# Patient Record
Sex: Female | Born: 1995 | Race: White | Hispanic: No | Marital: Married | Smoking: Current some day smoker
Health system: Southern US, Community
[De-identification: ages and names within clinical notes are randomized; demographics above are authoritative.]

## PROBLEM LIST (undated history)

## (undated) DIAGNOSIS — F419 Anxiety disorder, unspecified: Secondary | ICD-10-CM

## (undated) DIAGNOSIS — F329 Major depressive disorder, single episode, unspecified: Secondary | ICD-10-CM

## (undated) DIAGNOSIS — F32A Depression, unspecified: Secondary | ICD-10-CM

## (undated) DIAGNOSIS — L509 Urticaria, unspecified: Secondary | ICD-10-CM

## (undated) HISTORY — DX: Anxiety disorder, unspecified: F41.9

## (undated) HISTORY — DX: Major depressive disorder, single episode, unspecified: F32.9

## (undated) HISTORY — PX: NO PAST SURGERIES: SHX2092

## (undated) HISTORY — DX: Urticaria, unspecified: L50.9

## (undated) HISTORY — DX: Depression, unspecified: F32.A

---
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
## 2013-01-20 ENCOUNTER — Institutional Professional Consult (permissible substitution): Payer: Medicaid Other | Admitting: Pediatrics

## 2013-12-07 ENCOUNTER — Emergency Department (HOSPITAL_COMMUNITY)
Admission: EM | Admit: 2013-12-07 | Discharge: 2013-12-07 | Disposition: A | Discharge status: Home / Self Care | Payer: Medicaid Other | Attending: Emergency Medicine | Admitting: Emergency Medicine

## 2013-12-07 ENCOUNTER — Encounter (HOSPITAL_COMMUNITY): Payer: Self-pay | Admitting: Emergency Medicine

## 2013-12-07 DIAGNOSIS — L293 Anogenital pruritus, unspecified: Secondary | ICD-10-CM | POA: Insufficient documentation

## 2013-12-07 DIAGNOSIS — Z3202 Encounter for pregnancy test, result negative: Secondary | ICD-10-CM | POA: Insufficient documentation

## 2013-12-07 DIAGNOSIS — R300 Dysuria: Secondary | ICD-10-CM | POA: Insufficient documentation

## 2013-12-07 DIAGNOSIS — N898 Other specified noninflammatory disorders of vagina: Secondary | ICD-10-CM | POA: Insufficient documentation

## 2013-12-07 DIAGNOSIS — F17200 Nicotine dependence, unspecified, uncomplicated: Secondary | ICD-10-CM | POA: Insufficient documentation

## 2013-12-07 LAB — URINE MICROSCOPIC-ADD ON

## 2013-12-07 LAB — WET PREP, GENITAL
Clue Cells Wet Prep HPF POC: NONE SEEN
Trich, Wet Prep: NONE SEEN
Yeast Wet Prep HPF POC: NONE SEEN

## 2013-12-07 LAB — URINALYSIS, ROUTINE W REFLEX MICROSCOPIC
Bilirubin Urine: NEGATIVE
Glucose, UA: NEGATIVE mg/dL
Ketones, ur: NEGATIVE mg/dL
NITRITE: NEGATIVE
PROTEIN: NEGATIVE mg/dL
SPECIFIC GRAVITY, URINE: 1.015 (ref 1.005–1.030)
UROBILINOGEN UA: 0.2 mg/dL (ref 0.0–1.0)
pH: 8 (ref 5.0–8.0)

## 2013-12-07 LAB — POC URINE PREG, ED: PREG TEST UR: NEGATIVE

## 2013-12-07 MED ORDER — PHENAZOPYRIDINE HCL 100 MG PO TABS: 100.0000 mg | ORAL_TABLET | Freq: Three times a day (TID) | ORAL | Status: DC | PRN

## 2013-12-07 MED ORDER — AZITHROMYCIN 250 MG PO TABS
1000.0000 mg | ORAL_TABLET | Freq: Once | ORAL | Status: AC
  Administered 2013-12-07: 1000 mg via ORAL
  Filled 2013-12-07: qty 4

## 2013-12-07 MED ORDER — LIDOCAINE HCL (PF) 1 % IJ SOLN
INTRAMUSCULAR | Status: AC
  Administered 2013-12-07: 21:00:00
  Filled 2013-12-07: qty 5

## 2013-12-07 MED ORDER — PHENAZOPYRIDINE HCL 100 MG PO TABS
200.0000 mg | ORAL_TABLET | Freq: Once | ORAL | Status: AC
  Administered 2013-12-07: 200 mg via ORAL
  Filled 2013-12-07: qty 2

## 2013-12-07 MED ORDER — CEFTRIAXONE SODIUM 250 MG IJ SOLR
250.0000 mg | Freq: Once | INTRAMUSCULAR | Status: AC
  Administered 2013-12-07: 250 mg via INTRAMUSCULAR
  Filled 2013-12-07: qty 250

## 2013-12-07 MED ORDER — ONDANSETRON HCL 4 MG PO TABS
4.0000 mg | ORAL_TABLET | Freq: Once | ORAL | Status: AC
  Administered 2013-12-07: 4 mg via ORAL
  Filled 2013-12-07: qty 1

## 2013-12-07 MED ORDER — CEPHALEXIN 500 MG PO CAPS: 500.0000 mg | ORAL_CAPSULE | Freq: Four times a day (QID) | ORAL | Status: DC

## 2013-12-07 NOTE — Discharge Instructions (Signed)
Please increase fluids. No sexual activity for the next 7 days. Someone from the FLOW MANAGERS office will call with your test results. You have a urinary tract infection. Please increase fluids. Please use pyridium and keflex with food. Please see your [place] access MD for recheck of your urine in 7 to 10 days. Dysuria Dysuria is the medical term for pain with urination. There are many causes for dysuria, but urinary tract infection is the most common. If a urinalysis was performed it can show that there is a urinary tract infection. A urine culture confirms that you or your child is sick. You will need to follow up with a healthcare provider because:  If a urine culture was done you will need to know the culture results and treatment recommendations.  If the urine culture was positive, you or your child will need to be put on antibiotics or know if the antibiotics prescribed are the right antibiotics for your urinary tract infection.  If the urine culture is negative (no urinary tract infection), then other causes may need to be explored or antibiotics need to be stopped. Today laboratory work may have been done and there does not seem to be an infection. If cultures were done they will take at least 24 to 48 hours to be completed. Today x-rays may have been taken and they read as normal. No cause can be found for the problems. The x-rays may be re-read by a radiologist and you will be contacted if additional findings are made. You or your child may have been put on medications to help with this problem until you can see your primary caregiver. If the problems get better, see your primary caregiver if the problems return. If you were given antibiotics (medications which kill germs), take all of the mediations as directed for the full course of treatment.  If laboratory work was done, you need to find the results. Leave a telephone number where you can be reached. If this is not possible, make sure you find  out how you are to get test results. HOME CARE INSTRUCTIONS   Drink lots of fluids. For adults, drink eight, 8 ounce glasses of clear juice or water a day. For children, replace fluids as suggested by your caregiver.  Empty the bladder often. Avoid holding urine for long periods of time.  After a bowel movement, women should cleanse front to back, using each tissue only once.  Empty your bladder before and after sexual intercourse.  Take all the medicine given to you until it is gone. You may feel better in a few days, but TAKE ALL MEDICINE.  Avoid caffeine, tea, alcohol and carbonated beverages, because they tend to irritate the bladder.  In men, alcohol may irritate the prostate.  Only take over-the-counter or prescription medicines for pain, discomfort, or fever as directed by your caregiver.  If your caregiver has given you a follow-up appointment, it is very important to keep that appointment. Not keeping the appointment could result in a chronic or permanent injury, pain, and disability. If there is any problem keeping the appointment, you must call back to this facility for assistance. SEEK IMMEDIATE MEDICAL CARE IF:   Back pain develops.  A fever develops.  There is nausea (feeling sick to your stomach) or vomiting (throwing up).  Problems are no better with medications or are getting worse. MAKE SURE YOU:   Understand these instructions.  Will watch your condition.  Will get help right away if you  are not doing well or get worse. Document Released: 12/30/2003 Document Revised: 06/25/2011 Document Reviewed: 11/06/2007 [facility] Patient Information 2015 [place], [place]. This information is not intended to replace advice given to you by your health care provider. Make sure you discuss any questions you have with your health care provider.

## 2013-12-07 NOTE — ED Provider Notes (Signed)
CSN: [id number]     Arrival date & time 12/07/13  1802 History  This chart was scribed for Ivery Quale, PA, working with Vanetta Mulders, MD found by Elon Spanner, ED Scribe. This patient was seen in room APFT20/APFT20 and the patient's care was started at 7:22 PM.    Chief Complaint  Patient presents with  . Vaginal Itching   Patient is a 18 y.o. female presenting with vaginal itching. The history is provided by the patient. No language interpreter was used.  Vaginal Itching  Vaginal Itching    HPI Comments: Amy Whitaker is a 18 y.o. female who presents to the Emergency Department complaining of vaginal itching, vaginal irritation and discomfort with urination onset 3 days ago with progressive worsening.  Patient states she has undergone natural child birth approximately 2 years ago.  Patient denies any previous operation involving vagina.  She denies temperature elevation, chills, injury/trauma, unusual vaginal bleeding.     History reviewed. No pertinent past medical history. History reviewed. No pertinent past surgical history. History reviewed. No pertinent family history. History  Substance Use Topics  . Smoking status: Light Tobacco Smoker  . Smokeless tobacco: Not on file  . Alcohol Use: No   OB History   Grav Para Term Preterm Abortions TAB SAB Ect Mult Living                 Review of Systems  Genitourinary: Positive for dysuria, vaginal discharge and vaginal pain.  All other systems reviewed and are negative.     Allergies  Review of patient's allergies indicates no known allergies.  Home Medications   Prior to Admission medications   Medication Sig Start Date End Date Taking? Authorizing Provider  Benzocaine-Resorcinol (VAGICAINE) 20-3 % CREA Place 1 application vaginally once as needed.   Yes Historical Provider, MD   BP 107/88  Pulse 87  Temp(Src) 99.2 F (37.3 C) (Oral)  Resp 24  Ht  (1.499 m)  Wt 91 lb 9.6 oz (41.549 kg)  BMI 18.49  kg/m2  SpO2 100%  LMP 11/16/2013 Physical Exam  Nursing note and vitals reviewed. Constitutional: She is oriented to person, place, and time. She appears well-developed and well-nourished. No distress.  HENT:  Head: Normocephalic and atraumatic.  Eyes: Conjunctivae and EOM are normal.  Neck: Neck supple. No tracheal deviation present.  Cardiovascular: Normal rate.   Pulmonary/Chest: Effort normal. No respiratory distress.  Abdominal: Soft. Bowel sounds are normal. She exhibits no distension and no mass. There is no tenderness. There is no rebound and no guarding.  Genitourinary:  Chaperone present during examination.  No abnormality of the external vaginal area or rash.  No foreign body in the vaginal vault.  There is a brown to green discharge in the vaginal vault.  The cervix is friable.  Mild to moderate discomfort in the adnexa area but no actual pain.  No mass in the adnexa area.    Musculoskeletal: Normal range of motion.  Neurological: She is alert and oriented to person, place, and time.  Skin: Skin is warm and dry.  Psychiatric: She has a normal mood and affect. Her behavior is normal.    ED Course  Procedures (including critical care time)  DIAGNOSTIC STUDIES: Oxygen Saturation is 100% on RA, normal by my interpretation.    COORDINATION OF CARE:  7:35 PM Discussed treatment plan with patient at bedside.  Patient acknowledges and agrees with plan.    Labs Review Labs Reviewed  GC/CHLAMYDIA PROBE AMP  WET PREP, GENITAL    Imaging Review No results found.   EKG Interpretation None      MDM Urine pregnancy is negative. Wet prep reveals moderate white blood cells otherwise negative. Urinalysis shows a yellow hazy specimen with a large amount of hemoglobin, and trace leukocyte esterase. The microscopic reveals 11-20 red cells, 0-2 white cells. Many epithelial cells are noted. The urine was sent to the lab for culture. The patient was treated in the emergency  department with Rocephin and Zithromax. Patient was given instructions that someone from the flow managers office will be calling her concerning the results of her test. She is encouraged to refrain from sexual activity until these tests have come back.    Final diagnoses:  None    **I have reviewed nursing notes, vital signs, and all appropriate lab and imaging results for this patient.  **I personally performed the services described in this documentation, which was scribed in my presence. The recorded information has been reviewed and is accurate.Kathie Dike, PA-C 12/08/13 1640

## 2013-12-07 NOTE — ED Notes (Signed)
Pt with vaginal itching, burning and redness; discharge from urethra

## 2013-12-09 LAB — URINE CULTURE
Colony Count: NO GROWTH
Culture: NO GROWTH

## 2013-12-09 LAB — GC/CHLAMYDIA PROBE AMP
CT Probe RNA: NEGATIVE
GC PROBE AMP APTIMA: NEGATIVE

## 2013-12-11 ENCOUNTER — Emergency Department (HOSPITAL_COMMUNITY)
Admission: EM | Admit: 2013-12-11 | Discharge: 2013-12-12 | Disposition: A | Discharge status: Home / Self Care | Payer: Medicaid Other | Attending: Emergency Medicine | Admitting: Emergency Medicine

## 2013-12-11 ENCOUNTER — Encounter (HOSPITAL_COMMUNITY): Payer: Self-pay | Admitting: Emergency Medicine

## 2013-12-11 DIAGNOSIS — A6009 Herpesviral infection of other urogenital tract: Secondary | ICD-10-CM

## 2013-12-11 DIAGNOSIS — F17200 Nicotine dependence, unspecified, uncomplicated: Secondary | ICD-10-CM | POA: Insufficient documentation

## 2013-12-11 DIAGNOSIS — A6000 Herpesviral infection of urogenital system, unspecified: Secondary | ICD-10-CM | POA: Insufficient documentation

## 2013-12-11 DIAGNOSIS — N898 Other specified noninflammatory disorders of vagina: Secondary | ICD-10-CM | POA: Insufficient documentation

## 2013-12-11 DIAGNOSIS — Z792 Long term (current) use of antibiotics: Secondary | ICD-10-CM | POA: Insufficient documentation

## 2013-12-11 NOTE — ED Notes (Signed)
Pt c/o vaginal irritation, redness, swelling , and brown discharge. Pt seen for the same Monday, but states it has gotten worse.

## 2013-12-12 LAB — URINE MICROSCOPIC-ADD ON

## 2013-12-12 LAB — WET PREP, GENITAL
Clue Cells Wet Prep HPF POC: NONE SEEN
TRICH WET PREP: NONE SEEN
Yeast Wet Prep HPF POC: NONE SEEN

## 2013-12-12 LAB — URINALYSIS, ROUTINE W REFLEX MICROSCOPIC
Bilirubin Urine: NEGATIVE
Glucose, UA: NEGATIVE mg/dL
Ketones, ur: NEGATIVE mg/dL
NITRITE: NEGATIVE
PH: 6 (ref 5.0–8.0)
Protein, ur: NEGATIVE mg/dL
SPECIFIC GRAVITY, URINE: 1.025 (ref 1.005–1.030)
UROBILINOGEN UA: 0.2 mg/dL (ref 0.0–1.0)

## 2013-12-12 MED ORDER — OXYCODONE-ACETAMINOPHEN 5-325 MG PO TABS: 1.0000 | ORAL_TABLET | ORAL | Status: DC | PRN

## 2013-12-12 MED ORDER — VALACYCLOVIR HCL 1 G PO TABS
1000.0000 mg | ORAL_TABLET | Freq: Two times a day (BID) | ORAL | Status: DC
Start: 2013-12-12 — End: 2015-07-22

## 2013-12-12 NOTE — ED Provider Notes (Signed)
CSN: [id number]     Arrival date & time 12/11/13  2335 History   First MD Initiated Contact with Patient 12/11/13 2359    This chart was scribed for Dione Booze, MD by Marica Otter, ED Scribe. This patient was seen in room [facility]10/[facility]10 and the patient's care was started at 12:05 AM.  Chief Complaint  Patient presents with  . Vaginal Discharge   The history is provided by the patient. No language interpreter was used.   PCP: Josue Hector, MD HPI Comments: Amy Whitaker is a 18 y.o. female, with a history of light tobacco use, who presents to the Emergency Department complaining of worsening brown vaginal discharge with associated dysuria and vaginal: pain, irritation, redness and swelling onset approximately 8 days ago. Pt rates her pain a 6 out of 10. Pt was seen for the same at the ED on 12/07/13 and was prescribed antibiotics for her Sx and she reports she has been compliant with the meds. Pt notes that while the dysuria has improved the remainder of her Sx have worsened since her last ED visit.   Pt reports she is not sexually active.   History reviewed. No pertinent past medical history. History reviewed. No pertinent past surgical history. History reviewed. No pertinent family history. History  Substance Use Topics  . Smoking status: Light Tobacco Smoker  . Smokeless tobacco: Not on file  . Alcohol Use: No   OB History   Grav Para Term Preterm Abortions TAB SAB Ect Mult Living                 Review of Systems  Constitutional: Negative for fever and chills.  Genitourinary: Positive for vaginal discharge.       Vaginal itching, redness and swelling   All other systems reviewed and are negative.  Allergies  Review of patient's allergies indicates no known allergies.  Home Medications   Prior to Admission medications   Medication Sig Start Date End Date Taking? Authorizing Provider  Benzocaine-Resorcinol (VAGICAINE) 20-3 % CREA Place 1 application vaginally once  as needed (pt states she did receive this prescription.).     Historical Provider, MD  cephALEXin (KEFLEX) 500 MG capsule Take 1 capsule (500 mg total) by mouth 4 (four) times daily. 12/07/13   Kathie Dike, PA-C  phenazopyridine (PYRIDIUM) 100 MG tablet Take 100 mg by mouth 3 (three) times daily as needed for pain (did not get it filled.). 12/07/13   Kathie Dike, PA-C   Triage Vitals: BP 107/72  Pulse 84  Temp(Src) 97.7 F (36.5 C) (Oral)  Resp 18  SpO2 100%  LMP 11/16/2013 Physical Exam  Nursing note and vitals reviewed. Constitutional: She is oriented to person, place, and time. She appears well-developed and well-nourished. No distress.  HENT:  Head: Normocephalic and atraumatic.  Eyes: Conjunctivae and EOM are normal. Pupils are equal, round, and reactive to light.  Neck: Normal range of motion. Neck supple. No JVD present. No tracheal deviation present.  Cardiovascular: Normal rate, regular rhythm and normal heart sounds.   No murmur heard. Pulmonary/Chest: Effort normal and breath sounds normal. She has no wheezes. She has no rales.  Abdominal: Soft. Bowel sounds are normal. She exhibits no distension and no mass. There is no tenderness.  Genitourinary: Vagina normal and uterus normal. No vaginal discharge found.  Erythema and tenderness of the labia minora with suggestion of early vesicle formation.  Musculoskeletal: Normal range of motion. She exhibits no edema.  Lymphadenopathy:  She has no cervical adenopathy.  Neurological: She is alert and oriented to person, place, and time. She has normal reflexes. No cranial nerve deficit. Coordination normal.  Skin: Skin is warm and dry. No rash noted.  Psychiatric: She has a normal mood and affect. Her behavior is normal. Thought content normal.    ED Course  Procedures (including critical care time) DIAGNOSTIC STUDIES: Oxygen Saturation is 100% on RA, nl by my interpretation.    COORDINATION OF CARE: 12:09 AM-Discussed  treatment plan which includes labs and pelvic exam with pt at bedside and pt agreed to plan.   Labs Review Results for orders placed during the hospital encounter of 12/11/13  WET PREP, GENITAL      Result Value Ref Range   Yeast Wet Prep HPF POC NONE SEEN  NONE SEEN   Trich, Wet Prep NONE SEEN  NONE SEEN   Clue Cells Wet Prep HPF POC NONE SEEN  NONE SEEN   WBC, Wet Prep HPF POC FEW (*) NONE SEEN  URINALYSIS, ROUTINE W REFLEX MICROSCOPIC      Result Value Ref Range   Color, Urine YELLOW  YELLOW   APPearance HAZY (*) CLEAR   Specific Gravity, Urine 1.025  1.005 - 1.030   pH 6.0  5.0 - 8.0   Glucose, UA NEGATIVE  NEGATIVE mg/dL   Hgb urine dipstick MODERATE (*) NEGATIVE   Bilirubin Urine NEGATIVE  NEGATIVE   Ketones, ur NEGATIVE  NEGATIVE mg/dL   Protein, ur NEGATIVE  NEGATIVE mg/dL   Urobilinogen, UA 0.2  0.0 - 1.0 mg/dL   Nitrite NEGATIVE  NEGATIVE   Leukocytes, UA TRACE (*) NEGATIVE  URINE MICROSCOPIC-ADD ON      Result Value Ref Range   Squamous Epithelial / LPF FEW (*) RARE   WBC, UA 3-6  <3 WBC/hpf   RBC / HPF 11-20  <3 RBC/hpf   MDM   Final diagnoses:  Herpes genitalis in women    Painful genital rash which seems most consistent with genital herpes. She is given a prescription for valacyclovir and is discharged. She is also given a prescription for oxycodone and acetaminophen for pain. All the records are reviewed and she was seen here 4 days ago at which time GC and Chlamydia cultures were negative. There is no indication for repeating cultures.  I personally performed the services described in this documentation, which was scribed in my presence. The recorded information has been reviewed and is accurate.     Dione Booze, MD 12/12/13 [phone]

## 2013-12-12 NOTE — Discharge Instructions (Signed)
Genital Herpes Genital herpes is a sexually transmitted disease. This means that it is a disease passed by having sex with an infected person. There is no cure for genital herpes. The time between attacks can be months to years. The virus may live in a person but produce no problems (symptoms). This infection can be passed to a baby as it travels down the birth canal (vagina). In a newborn, this can cause central nervous system damage, eye damage, or even death. The virus that causes genital herpes is usually HSV-2 virus. The virus that causes oral herpes is usually HSV-1. The diagnosis (learning what is wrong) is made through culture results. SYMPTOMS  Usually symptoms of pain and itching begin a few days to a week after contact. It first appears as small blisters that progress to small painful ulcers which then scab over and heal after several days. It affects the outer genitalia, birth canal, cervix, penis, anal area, buttocks, and thighs. HOME CARE INSTRUCTIONS   Keep ulcerated areas dry and clean.  Take medications as directed. Antiviral medications can speed up healing. They will not prevent recurrences or cure this infection. These medications can also be taken for suppression if there are frequent recurrences.  While the infection is active, it is contagious. Avoid all sexual contact during active infections.  Condoms may help prevent spread of the herpes virus.  Practice safe sex.  Wash your hands thoroughly after touching the genital area.  Avoid touching your eyes after touching your genital area.  Inform your caregiver if you have had genital herpes and become pregnant. It is your responsibility to insure a safe outcome for your baby in this pregnancy.  Only take over-the-counter or prescription medicines for pain, discomfort, or fever as directed by your caregiver. SEEK MEDICAL CARE IF:   You have a recurrence of this infection.  You do not respond to medications and are not  improving.  You have new sources of pain or discharge which have changed from the original infection.  You have an oral temperature above 102 F (38.9 C).  You develop abdominal pain.  You develop eye pain or signs of eye infection. Document Released: 03/30/2000 Document Revised: 06/25/2011 Document Reviewed: 04/20/2009 [facility] Patient Information 2015 [place], [place]. This information is not intended to replace advice given to you by your health care provider. Make sure you discuss any questions you have with your health care provider.  Valacyclovir caplets What is this medicine? VALACYCLOVIR (val ay SYE kloe veer) is an antiviral medicine. It is used to treat or prevent infections caused by certain kinds of viruses. Examples of these infections include herpes and shingles. This medicine will not cure herpes. This medicine may be used for other purposes; ask your health care provider or pharmacist if you have questions. COMMON BRAND NAME(S): Valtrex What should I tell my health care provider before I take this medicine? They need to know if you have any of these conditions: -acquired immunodeficiency syndrome (AIDS) -any other condition that may weaken the immune system -bone marrow or kidney transplant -kidney disease -an unusual or allergic reaction to valacyclovir, acyclovir, ganciclovir, valganciclovir, other medicines, foods, dyes, or preservatives -pregnant or trying to get pregnant -breast-feeding How should I use this medicine? Take this medicine by mouth with a glass of water. Follow the directions on the prescription label. You can take this medicine with or without food. Take your doses at regular intervals. Do not take your medicine more often than directed. Finish the full  course prescribed by your doctor or health care professional even if you think your condition is better. Do not stop taking except on the advice of your doctor or health care professional. Talk to your  pediatrician regarding the use of this medicine in children. While this drug may be prescribed for children as young as 2 years for selected conditions, precautions do apply. Overdosage: If you think you have taken too much of this medicine contact a poison control center or emergency room at once. NOTE: This medicine is only for you. Do not share this medicine with others. What if I miss a dose? If you miss a dose, take it as soon as you can. If it is almost time for your next dose, take only that dose. Do not take double or extra doses. What may interact with this medicine? -cimetidine -probenecid This list may not describe all possible interactions. Give your health care provider a list of all the medicines, herbs, non-prescription drugs, or dietary supplements you use. Also tell them if you smoke, drink alcohol, or use illegal drugs. Some items may interact with your medicine. What should I watch for while using this medicine? Tell your doctor or health care professional if your symptoms do not start to get better after 1 week. This medicine works best when taken early in the course of an infection, within the first 72 hours. Begin treatment as soon as possible after the first signs of infection like tingling, itching, or pain in the affected area. It is possible that genital herpes may still be spread even when you are not having symptoms. Always use safer sex practices like condoms made of latex or polyurethane whenever you have sexual contact. You should stay well hydrated while taking this medicine. Drink plenty of fluids. What side effects may I notice from receiving this medicine? Side effects that you should report to your doctor or health care professional as soon as possible: -allergic reactions like skin rash, itching or hives, swelling of the face, lips, or tongue -aggressive behavior -confusion -hallucinations -problems with balance, talking, walking -stomach  pain -tremor -trouble passing urine or change in the amount of urine Side effects that usually do not require medical attention (report to your doctor or health care professional if they continue or are bothersome): -dizziness -headache -nausea, vomiting This list may not describe all possible side effects. Call your doctor for medical advice about side effects. You may report side effects to FDA at 1-800-FDA-1088. Where should I keep my medicine? Keep out of the reach of children. Store at room temperature between 15 and 25 degrees C (59 and 77 degrees F). Keep container tightly closed. Throw away any unused medicine after the expiration date. NOTE: This sheet is a summary. It may not cover all possible information. If you have questions about this medicine, talk to your doctor, pharmacist, or health care provider.  2015, Elsevier/Gold Standard. (2012-03-18 16:34:05)  Acetaminophen; Oxycodone tablets What is this medicine? ACETAMINOPHEN; OXYCODONE (a set a MEE noe fen; ox i KOE done) is a pain reliever. It is used to treat mild to moderate pain. This medicine may be used for other purposes; ask your health care provider or pharmacist if you have questions. COMMON BRAND NAME(S): Endocet, Magnacet, Narvox, Percocet, Perloxx, Primalev, Primlev, Roxicet, Xolox What should I tell my health care provider before I take this medicine? They need to know if you have any of these conditions: -brain tumor -Crohn's disease, inflammatory bowel disease, or ulcerative colitis -drug  abuse or addiction -head injury -heart or circulation problems -if you often drink alcohol -kidney disease or problems going to the bathroom -liver disease -lung disease, asthma, or breathing problems -an unusual or allergic reaction to acetaminophen, oxycodone, other opioid analgesics, other medicines, foods, dyes, or preservatives -pregnant or trying to get pregnant -breast-feeding How should I use this  medicine? Take this medicine by mouth with a full glass of water. Follow the directions on the prescription label. Take your medicine at regular intervals. Do not take your medicine more often than directed. Talk to your pediatrician regarding the use of this medicine in children. Special care may be needed. Patients over 87 years old may have a stronger reaction and need a smaller dose. Overdosage: If you think you have taken too much of this medicine contact a poison control center or emergency room at once. NOTE: This medicine is only for you. Do not share this medicine with others. What if I miss a dose? If you miss a dose, take it as soon as you can. If it is almost time for your next dose, take only that dose. Do not take double or extra doses. What may interact with this medicine? -alcohol -antihistamines -barbiturates like amobarbital, butalbital, butabarbital, methohexital, pentobarbital, phenobarbital, thiopental, and secobarbital -benztropine -drugs for bladder problems like solifenacin, trospium, oxybutynin, tolterodine, hyoscyamine, and methscopolamine -drugs for breathing problems like ipratropium and tiotropium -drugs for certain stomach or intestine problems like propantheline, homatropine methylbromide, glycopyrrolate, atropine, belladonna, and dicyclomine -general anesthetics like etomidate, ketamine, nitrous oxide, propofol, desflurane, enflurane, halothane, isoflurane, and sevoflurane -medicines for depression, anxiety, or psychotic disturbances -medicines for sleep -muscle relaxants -naltrexone -narcotic medicines (opiates) for pain -phenothiazines like perphenazine, thioridazine, chlorpromazine, mesoridazine, fluphenazine, prochlorperazine, promazine, and trifluoperazine -scopolamine -tramadol -trihexyphenidyl This list may not describe all possible interactions. Give your health care provider a list of all the medicines, herbs, non-prescription drugs, or dietary  supplements you use. Also tell them if you smoke, drink alcohol, or use illegal drugs. Some items may interact with your medicine. What should I watch for while using this medicine? Tell your doctor or health care professional if your pain does not go away, if it gets worse, or if you have new or a different type of pain. You may develop tolerance to the medicine. Tolerance means that you will need a higher dose of the medication for pain relief. Tolerance is normal and is expected if you take this medicine for a long time. Do not suddenly stop taking your medicine because you may develop a severe reaction. Your body becomes used to the medicine. This does NOT mean you are addicted. Addiction is a behavior related to getting and using a drug for a non-medical reason. If you have pain, you have a medical reason to take pain medicine. Your doctor will tell you how much medicine to take. If your doctor wants you to stop the medicine, the dose will be slowly lowered over time to avoid any side effects. You may get drowsy or dizzy. Do not drive, use machinery, or do anything that needs mental alertness until you know how this medicine affects you. Do not stand or sit up quickly, especially if you are an older patient. This reduces the risk of dizzy or fainting spells. Alcohol may interfere with the effect of this medicine. Avoid alcoholic drinks. There are different types of narcotic medicines (opiates) for pain. If you take more than one type at the same time, you may have more side effects.  Give your health care provider a list of all medicines you use. Your doctor will tell you how much medicine to take. Do not take more medicine than directed. Call emergency for help if you have problems breathing. The medicine will cause constipation. Try to have a bowel movement at least every 2 to 3 days. If you do not have a bowel movement for 3 days, call your doctor or health care professional. Do not take Tylenol  (acetaminophen) or medicines that have acetaminophen with this medicine. Too much acetaminophen can be very dangerous. Many nonprescription medicines contain acetaminophen. Always read the labels carefully to avoid taking more acetaminophen. What side effects may I notice from receiving this medicine? Side effects that you should report to your doctor or health care professional as soon as possible: -allergic reactions like skin rash, itching or hives, swelling of the face, lips, or tongue -breathing difficulties, wheezing -confusion -light headedness or fainting spells -severe stomach pain -unusually weak or tired -yellowing of the skin or the whites of the eyes Side effects that usually do not require medical attention (report to your doctor or health care professional if they continue or are bothersome): -dizziness -drowsiness -nausea -vomiting This list may not describe all possible side effects. Call your doctor for medical advice about side effects. You may report side effects to FDA at 1-800-FDA-1088. Where should I keep my medicine? Keep out of the reach of children. This medicine can be abused. Keep your medicine in a safe place to protect it from theft. Do not share this medicine with anyone. Selling or giving away this medicine is dangerous and against the law. Store at room temperature between 20 and 25 degrees C (68 and 77 degrees F). Keep container tightly closed. Protect from light. This medicine may cause accidental overdose and death if it is taken by other adults, children, or pets. Flush any unused medicine down the toilet to reduce the chance of harm. Do not use the medicine after the expiration date. NOTE: This sheet is a summary. It may not cover all possible information. If you have questions about this medicine, talk to your doctor, pharmacist, or health care provider.  2015, Elsevier/Gold Standard. (2012-11-24 13:17:35)

## 2013-12-16 MED FILL — Hydrocodone-Acetaminophen Tab 5-325 MG: ORAL | Qty: 6 | Status: AC

## 2013-12-16 NOTE — ED Provider Notes (Signed)
Medical screening examination/treatment/procedure(s) were performed by non-physician practitioner and as supervising physician I was immediately available for consultation/collaboration.   EKG Interpretation None        Vanetta Mulders, MD 12/16/13 [phone]

## 2015-07-22 ENCOUNTER — Emergency Department (HOSPITAL_COMMUNITY)
Admission: EM | Admit: 2015-07-22 | Discharge: 2015-07-22 | Disposition: A | Discharge status: Home / Self Care | Payer: Medicaid Other | Attending: Emergency Medicine | Admitting: Emergency Medicine

## 2015-07-22 ENCOUNTER — Encounter (HOSPITAL_COMMUNITY): Payer: Self-pay | Admitting: *Deleted

## 2015-07-22 DIAGNOSIS — Y9289 Other specified places as the place of occurrence of the external cause: Secondary | ICD-10-CM | POA: Diagnosis not present

## 2015-07-22 DIAGNOSIS — T2200XA Burn of unspecified degree of shoulder and upper limb, except wrist and hand, unspecified site, initial encounter: Secondary | ICD-10-CM | POA: Diagnosis present

## 2015-07-22 DIAGNOSIS — T2210XA Burn of first degree of shoulder and upper limb, except wrist and hand, unspecified site, initial encounter: Secondary | ICD-10-CM | POA: Diagnosis not present

## 2015-07-22 DIAGNOSIS — Y939 Activity, unspecified: Secondary | ICD-10-CM | POA: Insufficient documentation

## 2015-07-22 DIAGNOSIS — X18XXXA Contact with other hot metals, initial encounter: Secondary | ICD-10-CM | POA: Diagnosis not present

## 2015-07-22 DIAGNOSIS — Y999 Unspecified external cause status: Secondary | ICD-10-CM | POA: Insufficient documentation

## 2015-07-22 DIAGNOSIS — Z23 Encounter for immunization: Secondary | ICD-10-CM | POA: Insufficient documentation

## 2015-07-22 DIAGNOSIS — Z87891 Personal history of nicotine dependence: Secondary | ICD-10-CM | POA: Insufficient documentation

## 2015-07-22 MED ORDER — IBUPROFEN 400 MG PO TABS
600.0000 mg | ORAL_TABLET | Freq: Once | ORAL | Status: AC
  Administered 2015-07-22: 600 mg via ORAL
  Filled 2015-07-22: qty 2

## 2015-07-22 MED ORDER — TETANUS-DIPHTH-ACELL PERTUSSIS 5-2.5-18.5 LF-MCG/0.5 IM SUSP
0.5000 mL | Freq: Once | INTRAMUSCULAR | Status: AC
  Administered 2015-07-22: 0.5 mL via INTRAMUSCULAR
  Filled 2015-07-22: qty 0.5
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
## 2015-07-22 NOTE — ED Provider Notes (Signed)
CSN: [id number]     Arrival date & time 07/22/15  2147 History   First MD Initiated Contact with Patient 07/22/15 2202     Chief Complaint  Patient presents with  . Burn     (Consider location/radiation/quality/duration/timing/severity/associated sxs/prior Treatment) Patient is a 20 y.o. female presenting with burn. The history is provided by the patient.  Burn Burn location:  Shoulder/arm Shoulder/arm burn location:  R arm Burn quality:  Red Time since incident:  4 hours Progression:  Unchanged Mechanism of burn:  Hot surface Incident location:  Work Relieved by:  Running affected area under water and NSAIDs Worsened by:  Movement   History reviewed. No pertinent past medical history. History reviewed. No pertinent past surgical history. No family history on file. Social History  Substance Use Topics  . Smoking status: Former Games developermoker  . Smokeless tobacco: None  . Alcohol Use: Yes     Comment: occasional   OB History    No data available     Review of Systems    Allergies  Review of patient's allergies indicates no known allergies.  Home Medications   Prior to Admission medications   Medication Sig Start Date End Date Taking? Authorizing Provider  Benzocaine-Resorcinol (VAGICAINE) 20-3 % CREA Place 1 application vaginally once as needed (pt states she did receive this prescription.).     Historical Provider, MD  cephALEXin (KEFLEX) 500 MG capsule Take 1 capsule (500 mg total) by mouth 4 (four) times daily. 12/07/13   Ivery QualeHobson Boniface Goffe, PA-C  oxyCODONE-acetaminophen (PERCOCET/ROXICET) 5-325 MG per tablet Take 1 tablet by mouth every 4 (four) hours as needed. 12/12/13   Dione Boozeavid Glick, MD  oxyCODONE-acetaminophen (PERCOCET/ROXICET) 5-325 MG per tablet Take 1 tablet by mouth every 4 (four) hours as needed. 12/12/13   Dione Boozeavid Glick, MD  phenazopyridine (PYRIDIUM) 100 MG tablet Take 100 mg by mouth 3 (three) times daily as needed for pain (did not get it filled.). 12/07/13   Ivery QualeHobson  Shundra Wirsing, PA-C  valACYclovir (VALTREX) 1000 MG tablet Take 1 tablet (1,000 mg total) by mouth 2 (two) times daily. 12/12/13   Dione Boozeavid Glick, MD   BP 102/69 mmHg  Pulse 82  Temp(Src) 98.7 F (37.1 C) (Oral)  Resp 16  Ht 5\' 1"  (1.549 m)  Wt 47.628 kg  BMI 19.85 kg/m2  SpO2 100%  LMP 06/29/2015 Physical Exam  Constitutional: She is oriented to person, place, and time. She appears well-developed and well-nourished.  Non-toxic appearance.  HENT:  Head: Normocephalic.  Right Ear: Tympanic membrane and external ear normal.  Left Ear: Tympanic membrane and external ear normal.  Eyes: EOM and lids are normal. Pupils are equal, round, and reactive to light.  Neck: Normal range of motion. Neck supple. Carotid bruit is not present.  Cardiovascular: Normal rate, regular rhythm, normal heart sounds, intact distal pulses and normal pulses.   Pulmonary/Chest: Breath sounds normal. No respiratory distress.  Abdominal: Soft. Bowel sounds are normal. There is no tenderness. There is no guarding.  Musculoskeletal: Normal range of motion.       Right forearm: She exhibits tenderness.       Arms: There is a 3 cm area of first-degree burn just below the elbow at the right forearm. There is no blister,. No red streaks appreciated. There is full range of motion of the right upper extremity.  Lymphadenopathy:       Head (right side): No submandibular adenopathy present.       Head (left side): No submandibular adenopathy present.  She has no cervical adenopathy.  Neurological: She is alert and oriented to person, place, and time. She has normal strength. No cranial nerve deficit or sensory deficit.  Skin: Skin is warm and dry.  Psychiatric: She has a normal mood and affect. Her speech is normal.  Nursing note and vitals reviewed.   ED Course  Procedures (including critical care time) Labs Review Labs Reviewed - No data to display  Imaging Review No results found. I have personally reviewed and  evaluated these images and lab results as part of my medical decision-making.   EKG Interpretation None      MDM Patient has a small area of first-degree burn on the right forearm. She sustained it at work earlier this evening while working with a hot piece of metal. The patient's tetanus status was updated. A dressing was applied to support the burn area and protected. Patient was given an ice pack to use. Patient was also treated with Motrin. The patient is to follow with her primary physician if any changes, problems, or concerns. Patient is in agreement with this discharge plan.    Final diagnoses:  None    *I have reviewed nursing notes, vital signs, and all appropriate lab and imaging results for this patient.[id number], PA-C 07/22/15 2250  Bethann Berkshire, MD 07/22/15 [phone]

## 2015-07-22 NOTE — Discharge Instructions (Signed)
Please cleanse the burn area gently with soap and water. Apply a dressing to protect the area. Use Tylenol or ibuprofen for soreness. See Dr. Lysbeth GalasNyland for follow-up and recheck if not improving. Burn Care Burns hurt your skin. When your skin is hurt, it is easier to get an infection. Follow your doctor's directions to help prevent an infection. HOME CARE  Wash your hands well before you change your bandage.  Change your bandage as often as told by your doctor.  Remove the old bandage. If the bandage sticks, soak it off with cool, clean water.  Gently clean the burn with mild soap and water.  Pat the burn dry with a clean, dry cloth.  Put a thin layer of medicated cream on the burn.  Put a clean bandage on as told by your doctor.  Keep the bandage clean and dry.  Raise (elevate) the burn for the first 24 hours. After that, follow your doctor's directions.  Only take medicine as told by your doctor. GET HELP RIGHT AWAY IF:   You have too much pain.  The skin near the burn is red, tender, puffy (swollen), or has red streaks.  The burn area has yellowish white fluid (pus) or a bad smell coming from it.  You have a fever. MAKE SURE YOU:   Understand these instructions.  Will watch your condition.  Will get help right away if you are not doing well or get worse.   This information is not intended to replace advice given to you by your health care provider. Make sure you discuss any questions you have with your health care provider.   Document Released: 01/10/2008 Document Revised: 06/25/2011 Document Reviewed: 08/23/2010 Elsevier Interactive Patient Education [employer]2016 Elsevier Inc.

## 2015-07-22 NOTE — ED Notes (Signed)
PA in to assess 

## 2015-07-22 NOTE — ED Notes (Signed)
Pt burnt her right arm just below her elbow, at work,  on a hot piece of metal around 6 pm.

## 2015-10-23 ENCOUNTER — Encounter (HOSPITAL_COMMUNITY): Payer: Self-pay | Admitting: Emergency Medicine

## 2015-10-23 ENCOUNTER — Emergency Department (HOSPITAL_COMMUNITY)
Admission: EM | Admit: 2015-10-23 | Discharge: 2015-10-23 | Disposition: A | Discharge status: Home / Self Care | Payer: Medicaid Other | Attending: Emergency Medicine | Admitting: Emergency Medicine

## 2015-10-23 DIAGNOSIS — Z79899 Other long term (current) drug therapy: Secondary | ICD-10-CM | POA: Diagnosis not present

## 2015-10-23 DIAGNOSIS — Z791 Long term (current) use of non-steroidal anti-inflammatories (NSAID): Secondary | ICD-10-CM | POA: Diagnosis not present

## 2015-10-23 DIAGNOSIS — Z87891 Personal history of nicotine dependence: Secondary | ICD-10-CM | POA: Insufficient documentation

## 2015-10-23 DIAGNOSIS — L509 Urticaria, unspecified: Secondary | ICD-10-CM | POA: Diagnosis not present

## 2015-10-23 MED ORDER — PREDNISONE 20 MG PO TABS: ORAL_TABLET | ORAL | Status: DC

## 2015-10-23 MED ORDER — DIPHENHYDRAMINE HCL 25 MG PO CAPS
25.0000 mg | ORAL_CAPSULE | Freq: Once | ORAL | Status: AC
  Administered 2015-10-23: 25 mg via ORAL
  Filled 2015-10-23: qty 1

## 2015-10-23 MED ORDER — PREDNISONE 50 MG PO TABS
60.0000 mg | ORAL_TABLET | Freq: Once | ORAL | Status: AC
  Administered 2015-10-23: 60 mg via ORAL
  Filled 2015-10-23: qty 1

## 2015-10-23 NOTE — ED Notes (Addendum)
Patient c/o hives. Per patient hives from "head to toe" with itching. Denies any difficulty swallowing or breath. Patient reports hives x2 days in which she has been taking benadryl and zyrtec. Patient unsure what reaction is coming from. Patient [facility] 2 days ago and given zyrtec, solumedrol, and benadry. Per patient hives progressively getting worse. Denies any new medications, foods, detergents, or soaps.

## 2015-10-23 NOTE — ED Provider Notes (Addendum)
CSN: [id number]     Arrival date & time 10/23/15  1325 History   First MD Initiated Contact with Patient 10/23/15 1540     Chief Complaint  Patient presents with  . Urticaria     (Consider location/radiation/quality/duration/timing/severity/associated sxs/prior Treatment) HPI  20 year old female presents with diffuse hives. Patient states that this developed 2 days ago. She is a Nature conservation officerstocker at [employer] and she looked down and noticed redness to her left arm. Then pulled up her shirt and had some on her trunk. It has been spreading since. Patient states that she has tried Benadryl, Zyrtec, and was given a Solu-Medrol shot by urgent care yesterday. However has had no relief. No obvious source. Denies any insect bites or stings. No tick bites. No headaches. No new medicines except for well beautician 3 weeks ago. She ate she took ibuprofen 2 days ago which she has never taken with Wellbutrin but has taken multiple times before with no issues. No new foods. Patient states the itching and rash are severe. Patient states that she has had a couple episodes of shortness of breath that have been transient and she thinks are associated with anxiety, which she has a longstanding history of. Not currently short of breath. No lip or tongue swelling.  History reviewed. No pertinent past medical history. History reviewed. No pertinent past surgical history. History reviewed. No pertinent family history. Social History  Substance Use Topics  . Smoking status: Former Games developermoker  . Smokeless tobacco: Never Used  . Alcohol Use: Yes     Comment: occasional   OB History    Gravida Para Term Preterm AB TAB SAB Ectopic Multiple Living   1 1 1       1      Review of Systems  Constitutional: Negative for fever.  HENT: Negative for sore throat and trouble swallowing.   Respiratory: Negative for shortness of breath.   Gastrointestinal: Negative for vomiting.  Skin: Positive for rash.  Neurological: Negative for  headaches.  All other systems reviewed and are negative.     Allergies  Review of patient's allergies indicates no known allergies.  Home Medications   Prior to Admission medications   Medication Sig Start Date End Date Taking? Authorizing Provider  buPROPion (WELLBUTRIN XL) 150 MG 24 hr tablet Take 300 mg by mouth every morning. 09/27/15 09/26/16 Yes Historical Provider, MD  etonogestrel (IMPLANON) 68 MG IMPL implant 1 each by Subdermal route once.   Yes Historical Provider, MD  ibuprofen (ADVIL,MOTRIN) 200 MG tablet Take 200 mg by mouth every 6 (six) hours as needed for mild pain or moderate pain.   Yes Historical Provider, MD  LORazepam (ATIVAN) 0.5 MG tablet Take 1 tablet by mouth every 6 (six) hours as needed. 09/27/15 10/27/15 Yes Historical Provider, MD   BP 102/80 mmHg  Pulse 102  Temp(Src) 97.9 F (36.6 C) (Oral)  Resp 16  Ht 5\' 1"  (1.549 m)  Wt 103 lb 9 oz (46.976 kg)  BMI 19.58 kg/m2  SpO2 100% Physical Exam  Constitutional: She is oriented to person, place, and time. She appears well-developed and well-nourished.  HENT:  Head: Normocephalic and atraumatic.  Right Ear: External ear normal.  Left Ear: External ear normal.  Nose: Nose normal.  Mouth/Throat: Uvula is midline, oropharynx is clear and moist and mucous membranes are normal. No uvula swelling.  No tongue, lip or oropharyngeal swelling  Eyes: Right eye exhibits no discharge. Left eye exhibits no discharge.  Cardiovascular: Normal rate, regular rhythm  and normal heart sounds.   Pulmonary/Chest: Effort normal and breath sounds normal. No stridor. She has no wheezes.  Abdominal: Soft. There is no tenderness.  Neurological: She is alert and oriented to person, place, and time.  Skin: Skin is warm and dry. Rash noted. Rash is urticarial.  Diffuse hives. Most notable in trunk (anterior and posterior) and arms. Small areas in proximal legs but not distal. No face involvement  Nursing note and vitals  reviewed.   ED Course  Procedures (including critical care time) Labs Review Labs Reviewed - No data to display  Imaging Review No results found. I have personally reviewed and evaluated these images and lab results as part of my medical decision-making.   EKG Interpretation None      MDM   Final diagnoses:  Full body hives    Patient has diffuse hives of unclear etiology. However does not appear to have anaphylaxis or more severe disease. At this point she is already on Benadryl and Zyrtec, I have encouraged her to continue this but will also give her prednisone. Given her first dose in the ED. I have encouraged her to follow-up with an allergist after the symptoms resolve to find a possible source. However she overall appears well besides the rash and is stable for discharge. Discussed strict return precautions.    Pricilla Loveless, MD 10/23/15 1558  Of note, nurse asks me to give patient benadryl per her request. Will give 25 mg po. She tells nurse she has not taken any benadryl at all today (it is now 4:20 pm)  Pricilla Loveless, MD 10/23/15 1620

## 2015-11-21 ENCOUNTER — Ambulatory Visit: Payer: Self-pay | Admitting: Allergy

## 2015-12-01 ENCOUNTER — Ambulatory Visit (INDEPENDENT_AMBULATORY_CARE_PROVIDER_SITE_OTHER): Payer: Medicaid Other | Admitting: Allergy

## 2015-12-01 ENCOUNTER — Encounter: Payer: Self-pay | Admitting: Allergy

## 2015-12-01 VITALS — BP 110/66 | HR 72 | Temp 98.1°F | Resp 16 | Ht 60.43 in | Wt 112.0 lb

## 2015-12-01 DIAGNOSIS — F419 Anxiety disorder, unspecified: Secondary | ICD-10-CM | POA: Insufficient documentation

## 2015-12-01 DIAGNOSIS — L509 Urticaria, unspecified: Secondary | ICD-10-CM | POA: Insufficient documentation

## 2015-12-01 NOTE — Patient Instructions (Addendum)
Urticaria (hives) Acute urticaria (<6 weeks duration).   No identifiable trigger at this time.    Monitor for fever, joint pains, swelling, bruising/purplish discoloration once hives have resolved.  Let us know if any of these occur.   Would avoid NSAIDs (ibuprofen, Advil, Motrin, Aleve) If hives reoccur, Start Zyrtec 10mg  daily and Zantac 150mg  twice a day.    If urtcaria persist would increase to Zyrtec 10mg  twice day.    Follow-up as needed or if hive reoccur

## 2015-12-01 NOTE — Progress Notes (Addendum)
New Patient Note  RE: Amy Whitaker MRN: [id number] DOB: 1995/06/12 Date of Office Visit: 12/01/2015  Referring provider: Rebecka ApleyHemberg, Katherine V, RN Primary care provider: Rebecka ApleyHEMBERG, KATHERINE V, RN  Chief Complaint: hives  History of present illness: Amy Linemanmber Knoche is a 20 y.o. female presenting today for consultation for hives.  She is present today with mother and son.    She reports she had a "bad reaction" where she broke out in hives "all over" about a month ago.  Described hives as red, raised welts, very itchy.  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  She then looked at her abdomen and legs and saw other areas involved.  She did have swelling of her feet with the hives, no joint pain, no fever.   Went to urgent care that day where she got a steroid and benadryl injection.  She reports that treatment did not help.   2 days later the hives were worse and she went back to an urgent care who told her to go to Ed.  She got 60mg  of prednisone at ED and discharged with a 14day course.  The hives did improve and resolved with the steroids and she has not had any further hives.  Did take benadryl as well which helped. She reports her day was normal prior to the onset of hives.  She did not eat anything new.   No stings.  No new soaps/lotions/detergents.  She started wellbutrin at least 2 weeks prior to hives.   She took ibuprofen the day the hives started.  Her PCP stopped her wellbutrin due to concern it was related to her hives.   Denies worsening of hives at pressure points or with activity or extremes of temp.    When she was about 5-6yo after getting out of a hot bath she had large red welts on her legs.    No nasal or ocular symptoms c/w allergies, no history of eczema or asthma or food allergy.      Review of systems: Review of Systems  Constitutional: Negative for chills and fever.  HENT: Negative for congestion and sore throat.   Eyes:  Negative for redness.  Respiratory: Negative for cough, shortness of breath and wheezing.   Gastrointestinal: Negative for nausea and vomiting.  Neurological: Negative for headaches.  Psychiatric/Behavioral: The patient is nervous/anxious.     All other systems negative unless noted above in HPI  Past medical history: Past Medical History:  Diagnosis Date  . Anxiety   . Depression   . Urticaria     Past surgical history: History reviewed. No pertinent surgical history.  Family history:  Family History  Problem Relation Age of Onset  . Asthma Maternal Grandmother   . COPD Maternal Grandmother   . Eczema Maternal Grandmother   . Allergic rhinitis Maternal Grandmother   . Eczema Maternal Grandfather   . Angioedema Neg Hx   . Immunodeficiency Neg Hx   . Urticaria Neg Hx     Social history: Social History   Social History  . Marital status: Married    Social History Main Topics  . Smoking status: Former Games developermoker  . Smokeless tobacco: Never Used  . Alcohol use Yes     Comment: occasional  . Drug use: No    Social History Narrative  . Lives in home with husband and son and dog in home.  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  Previous smoker,  quit 2015.      Medication List:   Medication List       Accurate as of 12/01/15  1:42 PM. Always use your most recent med list.          ibuprofen 200 MG tablet Commonly known as:  ADVIL,MOTRIN Take 200 mg by mouth every 6 (six) hours as needed for mild pain or moderate pain.   IMPLANON 68 MG Impl implant Generic drug:  etonogestrel 1 each by Subdermal route once.   LORazepam 0.5 MG tablet Commonly known as:  ATIVAN Take by mouth.       Known medication allergies: Allergies  Allergen Reactions  . Bupropion Hives     Physical examination: Blood pressure 110/66, pulse 72, temperature 98.1 F (36.7 C), temperature source Oral, resp. rate 16, height 5' 0.43" (1.535 m), weight 112 lb (50.8 kg).  General:  Alert, interactive, in no acute distress. HEENT: TMs pearly gray, turbinates non-edematous without discharge, post-pharynx non erythematous. Neck: Supple without lymphadenopathy. Lungs: Clear to auscultation without wheezing, rhonchi or rales. {no increased work of breathing. CV: Normal S1, S2 without murmurs. Abdomen: Nondistended, nontender. Skin: Warm and dry, without lesions or rashes. Extremities:  No clubbing, cyanosis or edema. Neuro:   Grossly intact.  Diagnositics/Labs: None today  Reviewed OV note from NP Hemberg from 10/25/15 for hospital follow-up for her hives.  Referred to allergy.  Wellburtin stopped and changed to ativan.    Assessment and plan:   Urticaria (hives) Acute urticaria (<6 weeks duration).   No identifiable trigger at this time.   Less likely due to Wellbutrin given 2 week duration previously taking this medication but would continue off if other options are available to treat her anxiety.  Her Ibuprofen use likely worsened urticaria as NSAIDs are known to destabilize mast cells.  Would avoid NSAIDs if possible.   Monitor for fever, joint pains, swelling, bruising/purplish discoloration if hives return. Let us know if any of these occur.   If hives reoccur, Start Zyrtec 10mg  daily and Zantac 150mg  twice a day.    If hives persist would increase to Zyrtec 10mg  twice day.   Consider work-up if hives become chronic.  At this time no testing/labs needed.   Follow-up as needed or if hive reoccur   I appreciate the opportunity to take part in Leann's care. Please do not hesitate to contact me with questions.  Sincerely,   Margo AyeShaylar Aslyn Cottman, MD Allergy/Immunology [facility]

## 2016-04-17 ENCOUNTER — Encounter (INDEPENDENT_AMBULATORY_CARE_PROVIDER_SITE_OTHER): Payer: Self-pay

## 2016-04-17 ENCOUNTER — Encounter (INDEPENDENT_AMBULATORY_CARE_PROVIDER_SITE_OTHER): Payer: Self-pay | Admitting: Internal Medicine

## 2016-05-08 ENCOUNTER — Ambulatory Visit (INDEPENDENT_AMBULATORY_CARE_PROVIDER_SITE_OTHER): Payer: Self-pay | Admitting: Internal Medicine

## 2016-05-08 ENCOUNTER — Encounter (INDEPENDENT_AMBULATORY_CARE_PROVIDER_SITE_OTHER): Payer: Self-pay | Admitting: Internal Medicine

## 2016-05-08 VITALS — BP 94/60 | HR 72 | Temp 98.1°F | Ht 60.0 in | Wt 102.1 lb

## 2016-05-08 DIAGNOSIS — R112 Nausea with vomiting, unspecified: Secondary | ICD-10-CM

## 2016-05-08 LAB — COMPREHENSIVE METABOLIC PANEL
ALK PHOS: 46 U/L (ref 33–115)
ALT: 10 U/L (ref 6–29)
AST: 13 U/L (ref 10–30)
Albumin: 4 g/dL (ref 3.6–5.1)
BUN: 14 mg/dL (ref 7–25)
CO2: 23 mmol/L (ref 20–31)
CREATININE: 0.78 mg/dL (ref 0.50–1.10)
Calcium: 9.2 mg/dL (ref 8.6–10.2)
Chloride: 108 mmol/L (ref 98–110)
Glucose, Bld: 87 mg/dL (ref 65–99)
Potassium: 4.2 mmol/L (ref 3.5–5.3)
SODIUM: 140 mmol/L (ref 135–146)
Total Bilirubin: 0.3 mg/dL (ref 0.2–1.2)
Total Protein: 6.1 g/dL (ref 6.1–8.1)

## 2016-05-08 LAB — CBC WITH DIFFERENTIAL/PLATELET
Basophils Absolute: 0 cells/uL (ref 0–200)
Basophils Relative: 0 %
EOS PCT: 2 %
Eosinophils Absolute: 112 cells/uL (ref 15–500)
HCT: 38.5 % (ref 35.0–45.0)
Hemoglobin: 12.7 g/dL (ref 11.7–15.5)
LYMPHS ABS: 1904 {cells}/uL (ref 850–3900)
Lymphocytes Relative: 34 %
MCH: 31.2 pg (ref 27.0–33.0)
MCHC: 33 g/dL (ref 32.0–36.0)
MCV: 94.6 fL (ref 80.0–100.0)
MONOS PCT: 8 %
MPV: 9.2 fL (ref 7.5–12.5)
Monocytes Absolute: 448 cells/uL (ref 200–950)
Neutro Abs: 3136 cells/uL (ref 1500–7800)
Neutrophils Relative %: 56 %
PLATELETS: 215 10*3/uL (ref 140–400)
RBC: 4.07 MIL/uL (ref 3.80–5.10)
RDW: 13.2 % (ref 11.0–15.0)
WBC: 5.6 10*3/uL (ref 3.8–10.8)

## 2016-05-08 NOTE — Progress Notes (Signed)
   Subjective:    Patient ID: Amy Whitaker, female    DOB: 05/08/1995, 21 y.o.   MRN: [id number]  HPI Referred by Sharon SellerKatherine Hemberg PA-C for nausea.  She says the vomiting is better. She has not taken vomited in about a week and a half. The vomiting was usually in the morning. The vomiting was not related to eating.  She tells me she feels pretty good. She is not using birth control at this time.  Her weight has remained the same.   In May of 2017 she had Nexplanon replaced and was doing good until August of 2017.  In August she apparently starting having vomiting episodes at least once a week for the next 3 months. In October she started having vomiting about once a day. After she vomits, she is generally okay.  In December she had the Nexplanon removed. Per records, several home pregnancy test were negative.   04/11/2016 Urine pregnancy negative.   Review of Systems     Past Medical History:  Diagnosis Date  . Anxiety   . Depression   . Urticaria     No past surgical history on file.  Allergies  Allergen Reactions  . Bupropion Hives    No current outpatient prescriptions on file prior to visit.   No current facility-administered medications on file prior to visit.     Objective:   Physical Exam Blood pressure 94/60, pulse 72, temperature 98.1 F (36.7 C), height 5' (1.524 m), weight 102 lb 1.6 oz (46.3 kg). Alert and oriented. Skin warm and dry. Oral mucosa is moist.   . Sclera anicteric, conjunctivae is pink. Thyroid not enlarged. No cervical lymphadenopathy. Lungs clear. Heart regular rate and rhythm.  Abdomen is soft. Bowel sounds are positive. No hepatomegaly. No abdominal masses felt. No tenderness.  No edema to lower extremities.         Assessment & Plan:  Nausea. Some better at this point. Am going to get a CBC, and a cmet. Further recommendations to follow.

## 2016-05-08 NOTE — Patient Instructions (Signed)
OV in 3 months. 

## 2016-08-06 ENCOUNTER — Encounter (INDEPENDENT_AMBULATORY_CARE_PROVIDER_SITE_OTHER): Payer: Self-pay | Admitting: Internal Medicine

## 2016-08-06 ENCOUNTER — Ambulatory Visit (INDEPENDENT_AMBULATORY_CARE_PROVIDER_SITE_OTHER): Payer: Medicaid Other | Admitting: Internal Medicine

## 2016-11-09 ENCOUNTER — Encounter: Payer: Self-pay | Admitting: Emergency Medicine

## 2016-11-09 ENCOUNTER — Emergency Department (HOSPITAL_COMMUNITY)
Admission: EM | Admit: 2016-11-09 | Discharge: 2016-11-09 | Disposition: A | Discharge status: Home / Self Care | Payer: Medicaid Other | Attending: Emergency Medicine | Admitting: Emergency Medicine

## 2016-11-09 DIAGNOSIS — Z87891 Personal history of nicotine dependence: Secondary | ICD-10-CM | POA: Insufficient documentation

## 2016-11-09 DIAGNOSIS — Z3A16 16 weeks gestation of pregnancy: Secondary | ICD-10-CM | POA: Diagnosis not present

## 2016-11-09 DIAGNOSIS — K2900 Acute gastritis without bleeding: Secondary | ICD-10-CM

## 2016-11-09 DIAGNOSIS — Z3490 Encounter for supervision of normal pregnancy, unspecified, unspecified trimester: Secondary | ICD-10-CM

## 2016-11-09 DIAGNOSIS — O218 Other vomiting complicating pregnancy: Secondary | ICD-10-CM | POA: Insufficient documentation

## 2016-11-09 LAB — CBC WITH DIFFERENTIAL/PLATELET
Basophils Absolute: 0 10*3/uL (ref 0.0–0.1)
Basophils Relative: 0 %
EOS PCT: 1 %
Eosinophils Absolute: 0.1 10*3/uL (ref 0.0–0.7)
HCT: 40.5 % (ref 36.0–46.0)
Hemoglobin: 14.1 g/dL (ref 12.0–15.0)
LYMPHS ABS: 1.8 10*3/uL (ref 0.7–4.0)
Lymphocytes Relative: 14 %
MCH: 32.5 pg (ref 26.0–34.0)
MCHC: 34.8 g/dL (ref 30.0–36.0)
MCV: 93.3 fL (ref 78.0–100.0)
Monocytes Absolute: 0.6 10*3/uL (ref 0.1–1.0)
Monocytes Relative: 5 %
Neutro Abs: 10.2 10*3/uL — ABNORMAL HIGH (ref 1.7–7.7)
Neutrophils Relative %: 80 %
PLATELETS: 213 10*3/uL (ref 150–400)
RBC: 4.34 MIL/uL (ref 3.87–5.11)
RDW: 12.5 % (ref 11.5–15.5)
WBC: 12.7 10*3/uL — ABNORMAL HIGH (ref 4.0–10.5)

## 2016-11-09 LAB — BASIC METABOLIC PANEL
Anion gap: 8 (ref 5–15)
BUN: 8 mg/dL (ref 6–20)
CO2: 22 mmol/L (ref 22–32)
CREATININE: 0.52 mg/dL (ref 0.44–1.00)
Calcium: 8.8 mg/dL — ABNORMAL LOW (ref 8.9–10.3)
Chloride: 105 mmol/L (ref 101–111)
GFR calc Af Amer: >60 mL/min (ref >60)
Glucose, Bld: 76 mg/dL (ref 65–99)
POTASSIUM: 3.7 mmol/L (ref 3.5–5.1)
SODIUM: 135 mmol/L (ref 135–145)

## 2016-11-09 MED ORDER — PROMETHAZINE HCL 25 MG PO TABS: 25.0000 mg | ORAL_TABLET | Freq: Four times a day (QID) | ORAL | 0 refills | Status: DC | PRN

## 2016-11-09 MED ORDER — SODIUM CHLORIDE 0.9 % IV BOLUS (SEPSIS)
1000.0000 mL | Freq: Once | INTRAVENOUS | Status: AC
  Administered 2016-11-09: 1000 mL via INTRAVENOUS

## 2016-11-09 MED ORDER — ONDANSETRON HCL 4 MG/2ML IJ SOLN
4.0000 mg | Freq: Once | INTRAMUSCULAR | Status: AC
  Administered 2016-11-09: 4 mg via INTRAVENOUS
  Filled 2016-11-09: qty 2

## 2016-11-09 NOTE — Discharge Instructions (Signed)
Clear liquids tonight. Prescription for nausea medication. Follow-up your OB/GYN doctor. Return if worse.

## 2016-11-09 NOTE — ED Provider Notes (Signed)
[facility]-EMERGENCY DEPT Provider Note   CSN: [id number] Arrival date & time: 11/09/16  1622     History   Chief Complaint Chief Complaint  Patient presents with  . Hematemesis    pregnant    HPI Amy Whitaker is a 21 y.o. female.  Is G2 P1 approximately [redacted] weeks pregnant presents with vomiting today with some blood-tinged emesis. She quantifies it as a very small amount. No black stool. No history of ulcers. No NSAID usage. [facility] [place]. Severity of symptoms mild. Nothing makes symptoms better or worse.      Past Medical History:  Diagnosis Date  . Anxiety   . Depression   . Urticaria     Patient Active Problem List   Diagnosis Date Noted  . Urticaria 12/01/2015  . Anxiety 12/01/2015    History reviewed. No pertinent surgical history.  OB History    Gravida Para Term Preterm AB Living   2 1 1     1    SAB TAB Ectopic Multiple Live Births                   Home Medications    Prior to Admission medications   Medication Sig Start Date End Date Taking? Authorizing Provider  acetaminophen (TYLENOL) 325 MG tablet Take 325 mg by mouth every 6 (six) hours as needed for moderate pain, fever or headache.   Yes [provider]  diphenhydrAMINE (BENADRYL) 25 MG tablet Take 25 mg by mouth at bedtime as needed for sleep.   Yes [provider]  Prenatal Vit-Fe Fumarate-FA (PRENATAL MULTIVITAMIN) TABS tablet Take 1 tablet by mouth daily.   Yes [provider]  promethazine (PHENERGAN) 25 MG tablet Take 1 tablet (25 mg total) by mouth every 6 (six) hours as needed. 11/09/16   Donnetta Hutchingook, Lemarcus Baggerly, MD    Family History Family History  Problem Relation Age of Onset  . Asthma Maternal Grandmother   . COPD Maternal Grandmother   . Eczema Maternal Grandmother   . Allergic rhinitis Maternal Grandmother   . Eczema Maternal Grandfather   . Angioedema Neg Hx   . Immunodeficiency Neg Hx   . Urticaria Neg Hx     Social History Social  History  Substance Use Topics  . Smoking status: Former Games developermoker  . Smokeless tobacco: Never Used  . Alcohol use No     Allergies   Bupropion and Ibuprofen   Review of Systems Review of Systems  All other systems reviewed and are negative.    Physical Exam Updated Vital Signs BP 105/62   Pulse 88   Temp 98.1 F (36.7 C) (Oral)   Resp 16   Ht 5' (1.524 m)   Wt 46.3 kg (102 lb)   SpO2 100%   BMI 19.92 kg/m   Physical Exam  Constitutional: She is oriented to person, place, and time. She appears well-developed and well-nourished.  HENT:  Head: Normocephalic and atraumatic.  Eyes: Conjunctivae are normal.  Neck: Neck supple.  Cardiovascular: Normal rate and regular rhythm.   Pulmonary/Chest: Effort normal and breath sounds normal.  Abdominal: Soft. Bowel sounds are normal.  Musculoskeletal: Normal range of motion.  Neurological: She is alert and oriented to person, place, and time.  Skin: Skin is warm and dry.  Psychiatric: She has a normal mood and affect. Her behavior is normal.  Nursing note and vitals reviewed.    ED Treatments / Results  Labs (all labs ordered are listed, but only  abnormal results are displayed) Labs Reviewed  CBC WITH DIFFERENTIAL/PLATELET - Abnormal; Notable for the following:       Result Value   WBC 12.7 (*)    Neutro Abs 10.2 (*)    All other components within normal limits  BASIC METABOLIC PANEL - Abnormal; Notable for the following:    Calcium 8.8 (*)    All other components within normal limits    EKG  EKG Interpretation None       Radiology No results found.  Procedures Procedures (including critical care time)  Medications Ordered in ED Medications  sodium chloride 0.9 % bolus 1,000 mL (0 mLs Intravenous Stopped 11/09/16 1821)  ondansetron (ZOFRAN) injection 4 mg (4 mg Intravenous Given 11/09/16 1720)     Initial Impression / Assessment and Plan / ED Course  I have reviewed the triage vital signs and the nursing  notes.  Pertinent labs & imaging results that were available during my care of the patient were reviewed by me and considered in my medical decision making (see chart for details).     Patient is in no acute distress. No obvious epigastric tenderness. Labs are as stable. She feels better after IV fluids and IV Zofran. Discharge medications Phenergan 25 mg  Final Clinical Impressions(s) / ED Diagnoses   Final diagnoses:  Pregnancy, unspecified gestational age  Acute gastritis, presence of bleeding unspecified, unspecified gastritis type    New Prescriptions New Prescriptions   PROMETHAZINE (PHENERGAN) 25 MG TABLET    Take 1 tablet (25 mg total) by mouth every 6 (six) hours as needed.     Donnetta Hutchingook, Azahel Belcastro, MD 11/09/16 2007

## 2016-11-09 NOTE — ED Triage Notes (Addendum)
Pt reports she woke up this morning feeling nauseated and then starting vomiting x 1. Pt reports she saw streaks of blood in her emesis. Pt reports she has morning sickness every day with this pregnancy but she has never seen blood before. Denies fever, diarrhea. Pt is [redacted] wks pregnant and [facility] [place]. Last OB appt 4 wks ago, normal pregnancy so far per pt.   Pt also c/o sharp lower abdominal pain every time she stretches, coughs or sneezes over the last couple of weeks.

## 2016-11-09 NOTE — ED Triage Notes (Signed)
Pt reports that she is [redacted] weeks pregnant today and was told to come by urgent care RN due to awakening this morning vomiting blood

## 2016-11-29 ENCOUNTER — Other Ambulatory Visit (HOSPITAL_COMMUNITY): Payer: Self-pay | Admitting: Obstetrics and Gynecology

## 2016-11-29 DIAGNOSIS — Z3689 Encounter for other specified antenatal screening: Secondary | ICD-10-CM

## 2016-12-07 ENCOUNTER — Encounter (HOSPITAL_COMMUNITY): Payer: Self-pay | Admitting: *Deleted

## 2016-12-11 ENCOUNTER — Ambulatory Visit (HOSPITAL_COMMUNITY): Admission: RE | Admit: 2016-12-11 | Payer: Medicaid Other | Source: Ambulatory Visit

## 2016-12-11 ENCOUNTER — Other Ambulatory Visit (HOSPITAL_COMMUNITY): Payer: Self-pay | Admitting: Obstetrics and Gynecology

## 2016-12-11 ENCOUNTER — Ambulatory Visit (HOSPITAL_COMMUNITY)
Admission: RE | Admit: 2016-12-11 | Discharge: 2016-12-11 | Disposition: A | Discharge status: Home / Self Care | Payer: Medicaid Other | Source: Ambulatory Visit | Attending: Obstetrics and Gynecology | Admitting: Obstetrics and Gynecology

## 2016-12-11 ENCOUNTER — Other Ambulatory Visit (HOSPITAL_COMMUNITY): Payer: Self-pay | Admitting: *Deleted

## 2016-12-11 ENCOUNTER — Encounter (HOSPITAL_COMMUNITY): Payer: Self-pay

## 2016-12-11 DIAGNOSIS — Z3689 Encounter for other specified antenatal screening: Secondary | ICD-10-CM

## 2016-12-11 DIAGNOSIS — O280 Abnormal hematological finding on antenatal screening of mother: Secondary | ICD-10-CM

## 2016-12-11 DIAGNOSIS — Z3A20 20 weeks gestation of pregnancy: Secondary | ICD-10-CM | POA: Insufficient documentation

## 2016-12-11 DIAGNOSIS — O281 Abnormal biochemical finding on antenatal screening of mother: Secondary | ICD-10-CM | POA: Diagnosis not present

## 2016-12-11 IMAGING — US US MFM OB DETAIL+14 WK
1 series · 13 of 28 positions shown · non-contrast
Comparison: none

[Series 1: us mfm ob detail+14 wk · 13 of 105 slices shown]
[im 4/105]
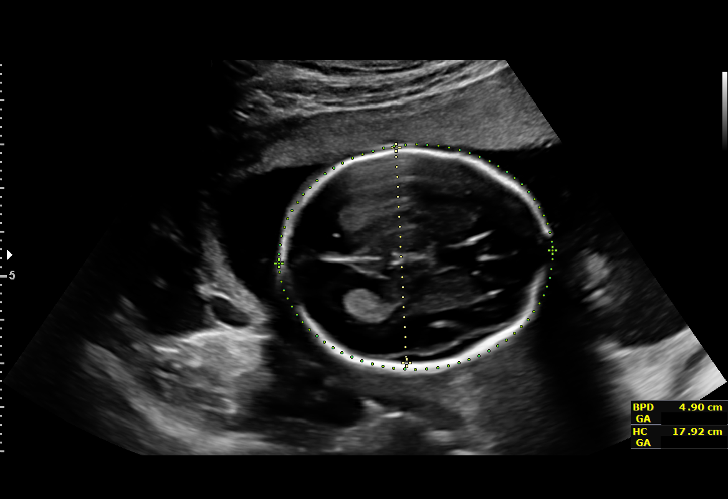
[im 12/105]
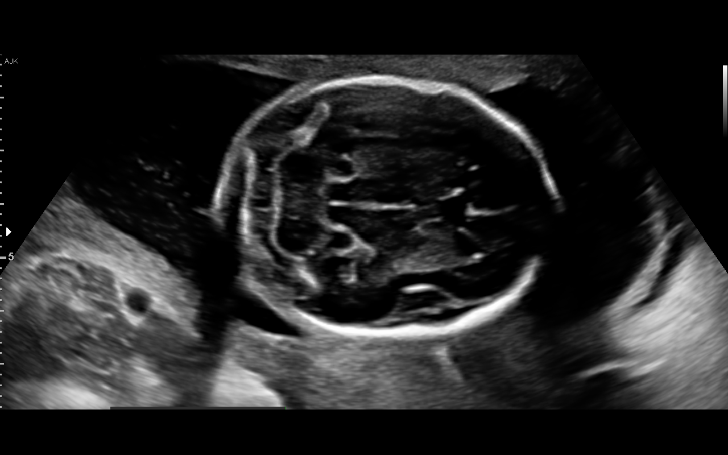
[im 20/105]
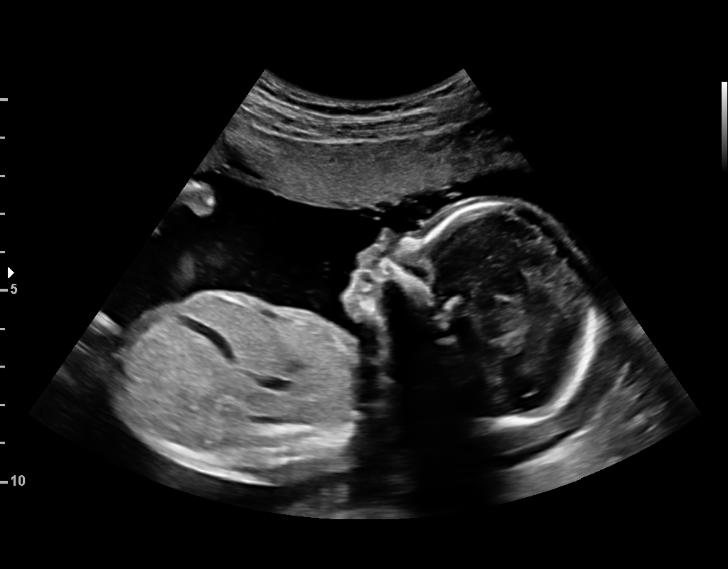
[im 27/105]
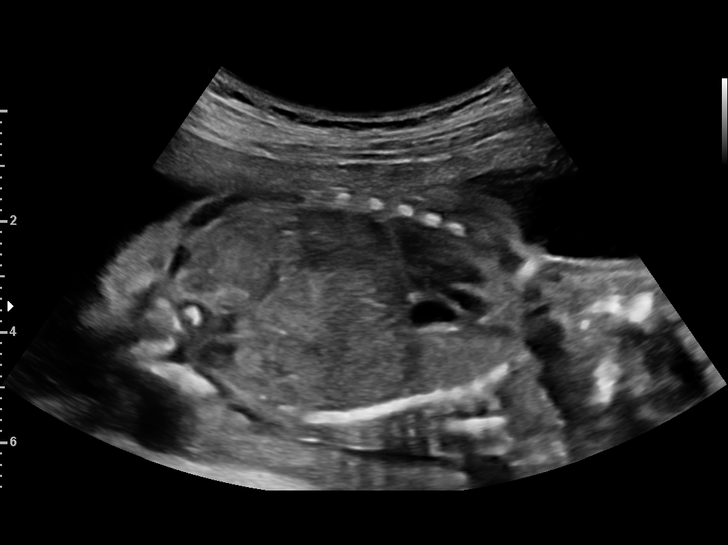
[im 35/105]
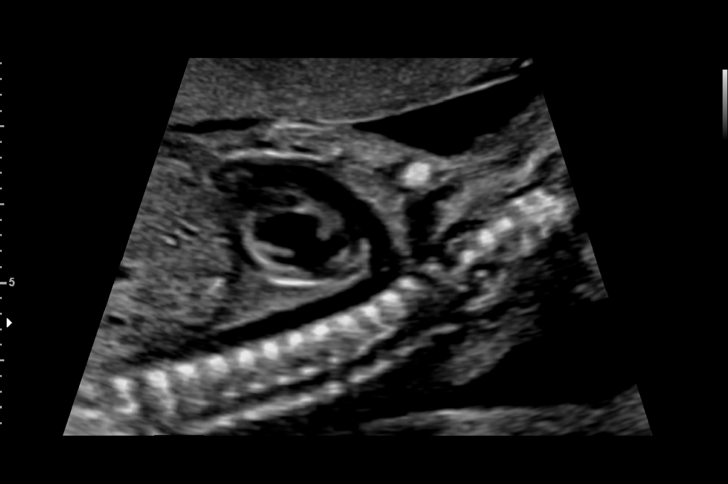
[im 43/105]
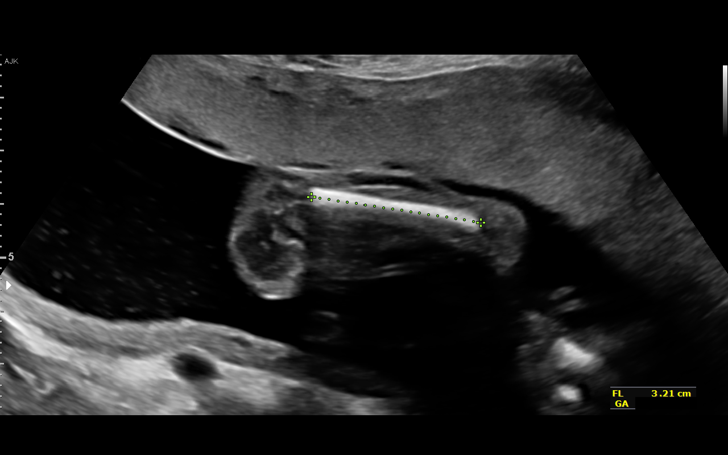
[im 54/105]
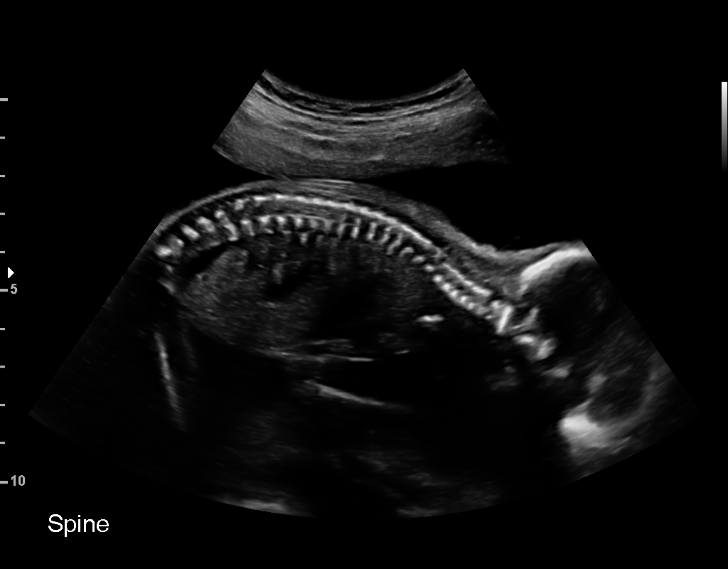
[im 62/105]
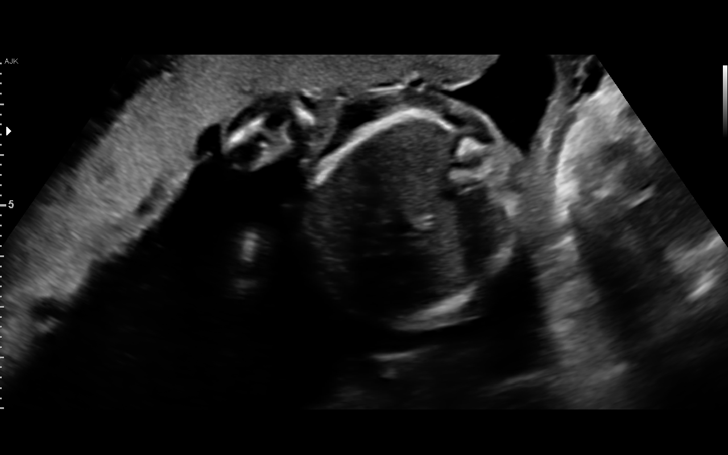
[im 70/105]
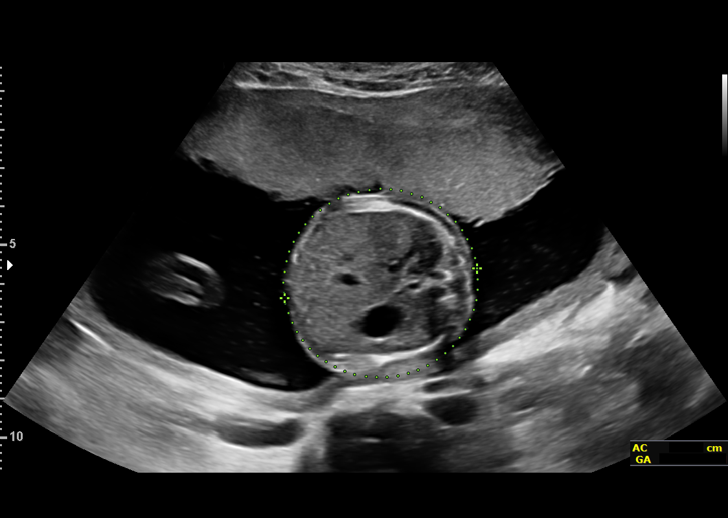
[im 78/105]
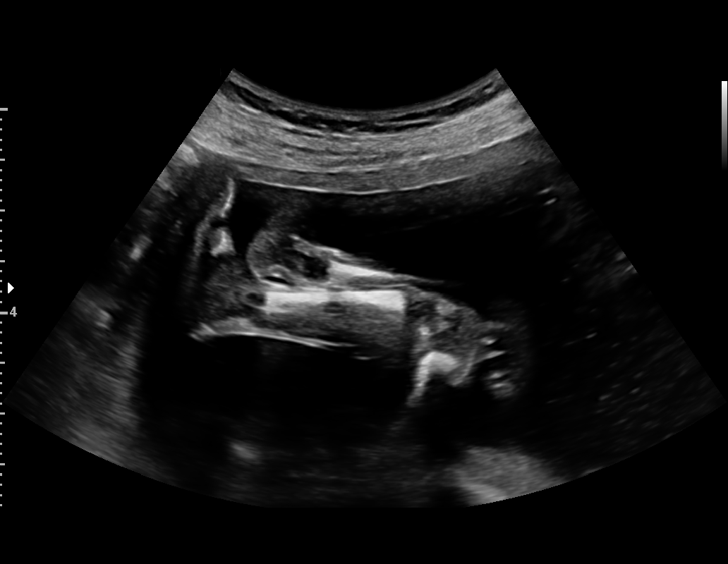
[im 85/105]
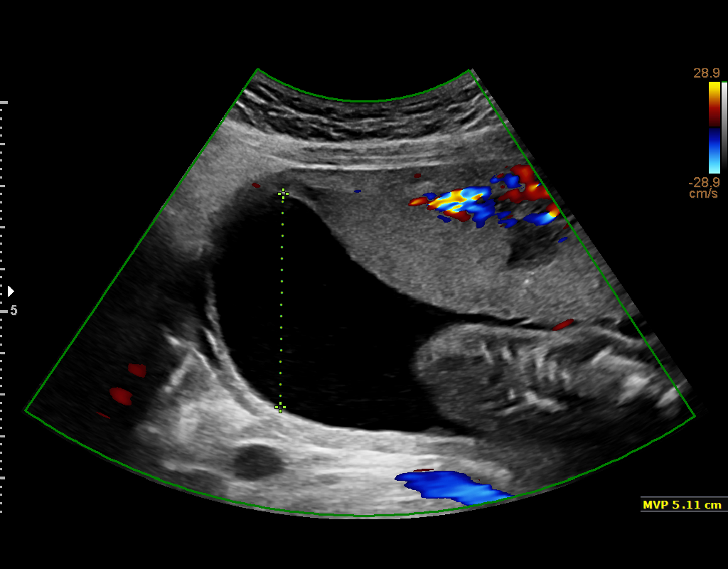
[im 93/105]
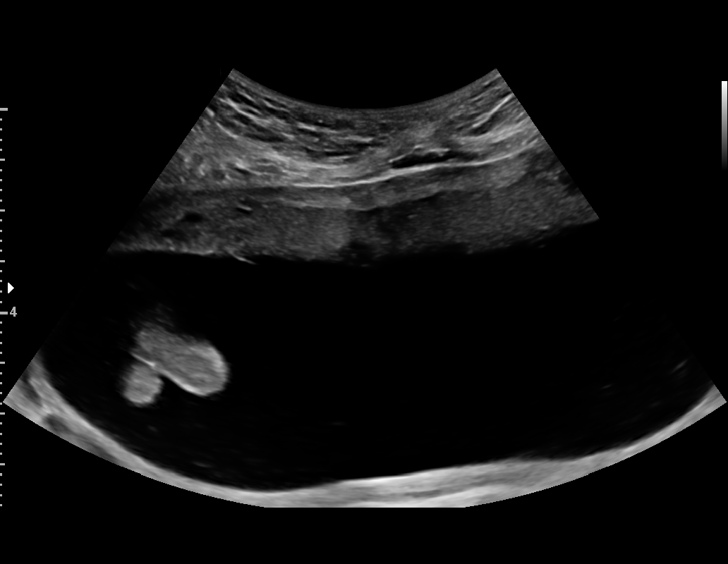
[im 101/105]
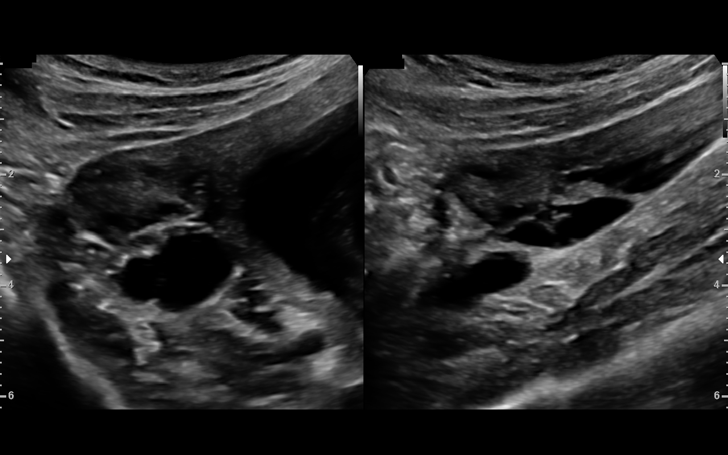

[13 of 28 positions shown; findings below may reference images not displayed]

1  ROZO          [PHONE_NUMBER]      [PHONE_NUMBER]     [PHONE_NUMBER]
Indications

20 weeks gestation of pregnancy
Encounter for fetal anatomic survey            [2J]
Abnormal biochemical screen ([DATE] ONTD)        [2J]
OB History

Gravidity:    2         Term:   1        Prem:   0        SAB:   0
TOP:          0       Ectopic:  0        Living: 1
Fetal Evaluation

Num Of Fetuses:     1
Fetal Heart         153
Rate(bpm):
Cardiac Activity:   Observed
Presentation:       Cephalic
Placenta:           Anterior, above cervical os
P. Cord Insertion:  Visualized, central

Amniotic Fluid
AFI FV:      Subjectively within normal limits

Largest Pocket(cm)
5.11
Biometry

BPD:      48.7  mm     G. Age:  20w 5d         68  %    CI:        73.07   %   70 - 86
FL/HC:      17.9   %   16.8 -
HC:      181.1  mm     G. Age:  20w 4d         53  %    HC/AC:      1.17       1.09 -
AC:      155.2  mm     G. Age:  20w 5d         57  %    FL/BPD:     66.7   %
FL:       32.5  mm     G. Age:  20w 1d         37  %    FL/AC:      20.9   %   20 - 24
CER:      21.6  mm     G. Age:  20w 4d         55  %
NFT:       5.2  mm
CM:        4.3  mm

Est. FW:     355  gm    0 lb 13 oz      51  %
Gestational Age

LMP:           20w 2d       Date:   [DATE]                 EDD:   [DATE]
U/S Today:     20w 4d                                        EDD:   [DATE]
Best:          20w 2d    Det. By:   LMP  ([DATE])          EDD:   [DATE]
Anatomy

Cranium:               Appears normal         Aortic Arch:            Appears normal
Cavum:                 Appears normal         Ductal Arch:            Appears normal
Ventricles:            Appears normal         Diaphragm:              Appears normal
Choroid Plexus:        Appears normal         Stomach:                Appears normal, left
sided
Cerebellum:            Appears normal         Abdomen:                Appears normal
Posterior Fossa:       Appears normal         Abdominal Wall:         Appears nml (cord
insert, abd wall)
Nuchal Fold:           Appears normal         Cord Vessels:           Appears normal (3
vessel cord)
Face:                  Appears normal         Kidneys:                Appear normal
(orbits and profile)
Lips:                  Appears normal         Bladder:                Appears normal
Thoracic:              Appears normal         Spine:                  Appears normal
Heart:                 Appears normal         Upper Extremities:      Appears normal
(4CH, axis, and
situs)
RVOT:                  Appears normal         Lower Extremities:      Appears normal
LVOT:                  Appears normal

Other:  Fetus appears to be a male. Heels and 5th digit visualized. Nasal
bone visualized.
Cervix Uterus Adnexa

Cervix
Length:           3.93  cm.
Normal appearance by transabdominal scan.

Uterus
No abnormality visualized.

Left Ovary
Within normal limits.

Right Ovary
Within normal limits.

Adnexa:       No abnormality visualized.
Impression

Singleton intrauterine pregnancy at 20+2 weeks with elevated
MSAFP 2.69 MoM
Review of the anatomy shows no sonographic markers for
aneuploidy or structural anomalies
Ventral wall and fetal neural axis normal in saggital and
coronal planes
Amniotic fluid volume is normal
Estimated fetal weight is 355g which is growth in the 51st
percentile
Recommendations

Discussed findings with the patient. will recheck anatomy in 4
weeks. If remains normal recommend repeat scan between
30-32 weeks to check for abnormal growth

## 2016-12-12 ENCOUNTER — Encounter (HOSPITAL_COMMUNITY): Payer: Self-pay

## 2016-12-12 ENCOUNTER — Other Ambulatory Visit (HOSPITAL_COMMUNITY): Payer: Self-pay

## 2017-01-10 ENCOUNTER — Other Ambulatory Visit (HOSPITAL_COMMUNITY): Payer: Self-pay | Admitting: Obstetrics and Gynecology

## 2017-01-10 ENCOUNTER — Ambulatory Visit (HOSPITAL_COMMUNITY)
Admission: RE | Admit: 2017-01-10 | Discharge: 2017-01-10 | Disposition: A | Discharge status: Home / Self Care | Payer: Medicaid Other | Source: Ambulatory Visit | Attending: Obstetrics and Gynecology | Admitting: Obstetrics and Gynecology

## 2017-01-10 ENCOUNTER — Encounter (HOSPITAL_COMMUNITY): Payer: Self-pay

## 2017-01-10 DIAGNOSIS — O289 Unspecified abnormal findings on antenatal screening of mother: Secondary | ICD-10-CM | POA: Insufficient documentation

## 2017-01-10 DIAGNOSIS — Z362 Encounter for other antenatal screening follow-up: Secondary | ICD-10-CM

## 2017-01-10 DIAGNOSIS — Z3A24 24 weeks gestation of pregnancy: Secondary | ICD-10-CM | POA: Insufficient documentation

## 2017-01-10 DIAGNOSIS — O280 Abnormal hematological finding on antenatal screening of mother: Secondary | ICD-10-CM

## 2017-01-10 IMAGING — US US MFM OB FOLLOW-UP
1 series · 14 of 28 positions shown · non-contrast
Comparison: none

1  PERRO MICHOACANO              [id number]      [id number]     [id number]
Indications

24 weeks gestation of pregnancy
Abnormal biochemical screen ([DATE] ONTD)
OB History
Gravidity:    2         Term:   1        Prem:   0        SAB:   0
TOP:          0       Ectopic:  0        Living: 1
Fetal Evaluation
Num Of Fetuses:     1
Fetal Heart         146
Rate(bpm):
Cardiac Activity:   Observed
Presentation:       Cephalic
Placenta:           Anterior, above cervical os
P. Cord Insertion:  Visualized, central
Amniotic Fluid
AFI FV:      Subjectively within normal limits
Largest Pocket(cm)
6.9
Biometry
BPD:      64.3  mm     G. Age:  26w 0d         87  %    CI:        76.57   %   70 - 86
FL/HC:      18.0   %   18.7 -
HC:      232.8  mm     G. Age:  25w 2d         58  %    HC/AC:      1.15       1.04 -
AC:      201.9  mm     G. Age:  24w 6d         49  %    FL/BPD:     65.3   %   71 - 87
FL:         42  mm     G. Age:  23w 5d         14  %    FL/AC:      20.8   %   20 - 24
HUM:      39.8  mm     G. Age:  24w 2d         35  %
Est. FW:     704  gm      1 lb 9 oz     51  %
Gestational Age
LMP:           24w 4d       Date:   07/22/16                 EDD:   04/28/17
U/S Today:     25w 0d                                        EDD:   04/25/17
Best:          24w 4d    Det. By:   LMP  (07/22/16)          EDD:   04/28/17
Anatomy
Cranium:               Appears normal         Aortic Arch:            Previously seen
Cavum:                 Previously seen        Ductal Arch:            Previously seen
Ventricles:            Appears normal         Diaphragm:              Previously seen
Choroid Plexus:        Previously seen        Stomach:                Appears normal, left
sided
Cerebellum:            Previously seen        Abdomen:                Previously seen
Posterior Fossa:       Previously seen        Abdominal Wall:         Previously seen
Nuchal Fold:           Previously seen        Cord Vessels:           Previously seen
Face:                  Orbits and profile     Kidneys:                Appear normal
previously seen
Lips:                  Previously seen        Bladder:                Appears normal
Thoracic:              Appears normal         Spine:                  Previously seen
Heart:                 Appears normal         Upper Extremities:      Previously seen
(4CH, axis, and
situs)
RVOT:                  Appears normal         Lower Extremities:      Previously seen
LVOT:                  Appears normal
Other:  Fetus appears to be a male. Heels and 5th digit visualized previously.
Nasal bone visualized previously.
Cervix Uterus Adnexa
Cervix
Length:            3.7  cm.
Normal appearance by transabdominal scan.
Uterus
No abnormality visualized.
Left Ovary
No adnexal mass visualized.
Right Ovary
Cul De Sac:   No free fluid seen.
Adnexa:       No abnormality visualized.
Impression
INDICATION: 21 yr old LENZWWZ at 34w4d with elevated MSAFP
of [id number] for fetal growth.

[Series 1: us mfm ob follow-up · 36 acquisitions, 14 frames shown]
[im 2/36]
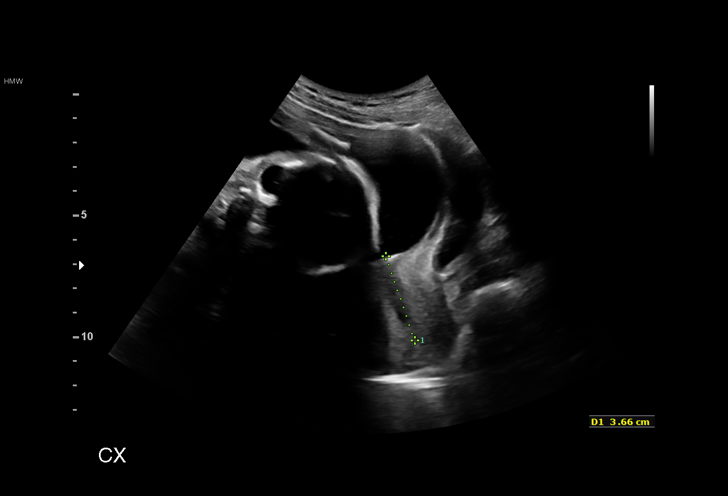
[im 4/36]
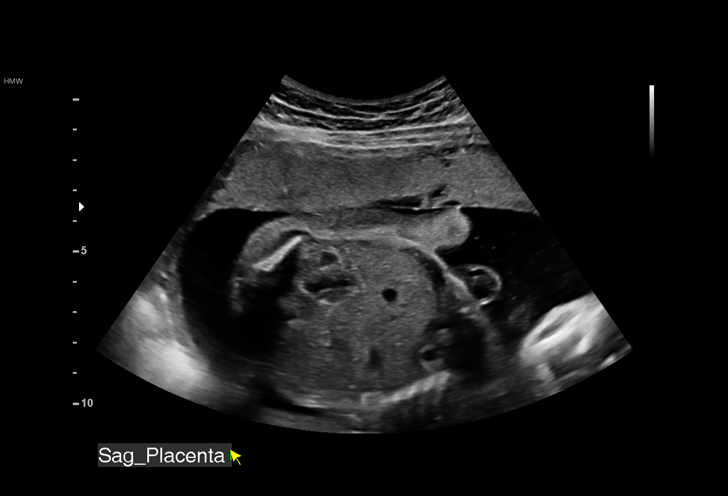
[im 7/36]
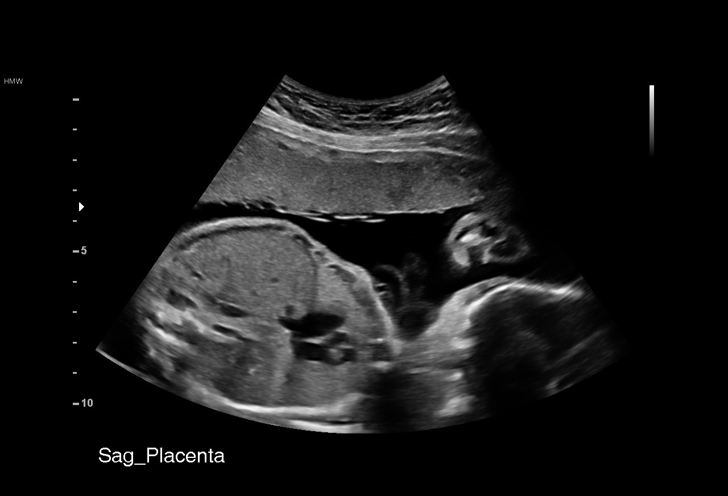
[im 10/36]
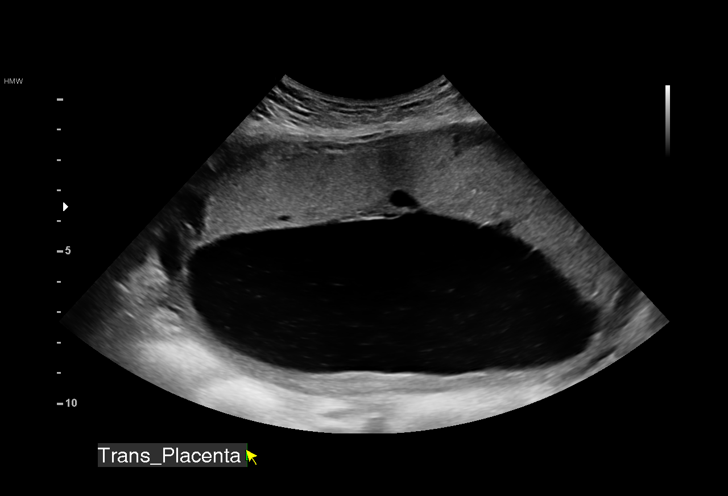
[im 12/36]
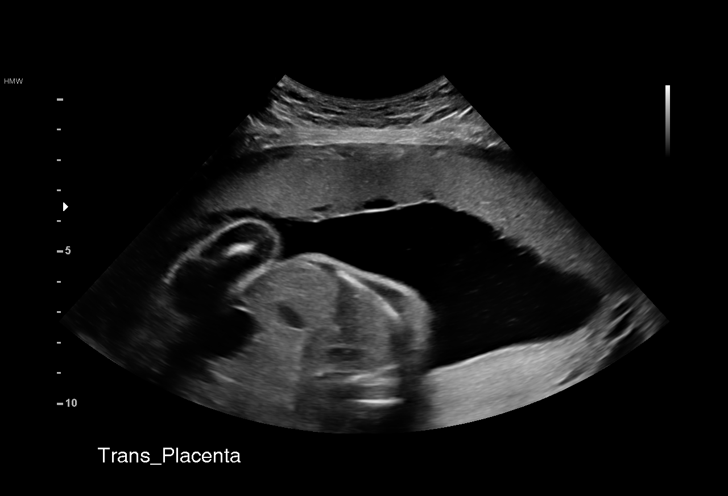
[im 15/36]
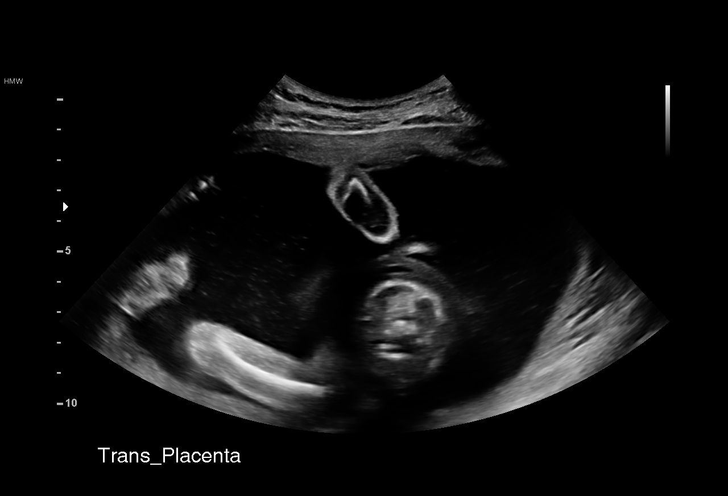
[im 17/36]
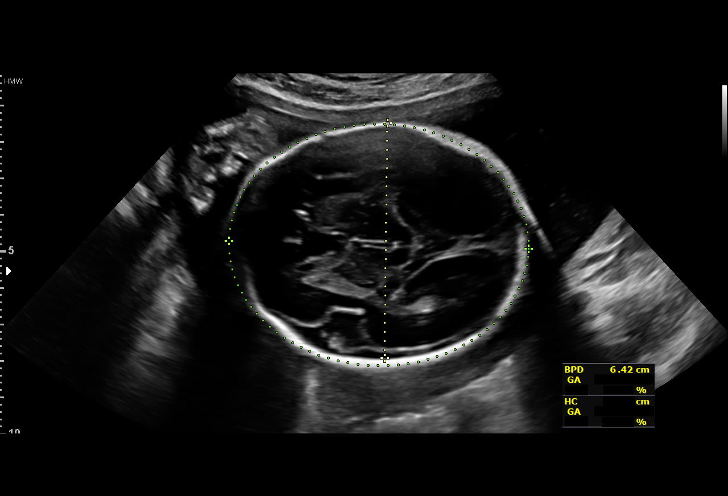
[im 20/36]
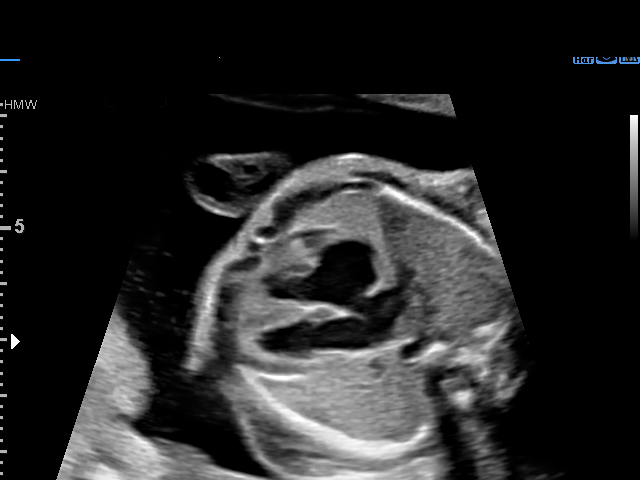
[im 23/36]
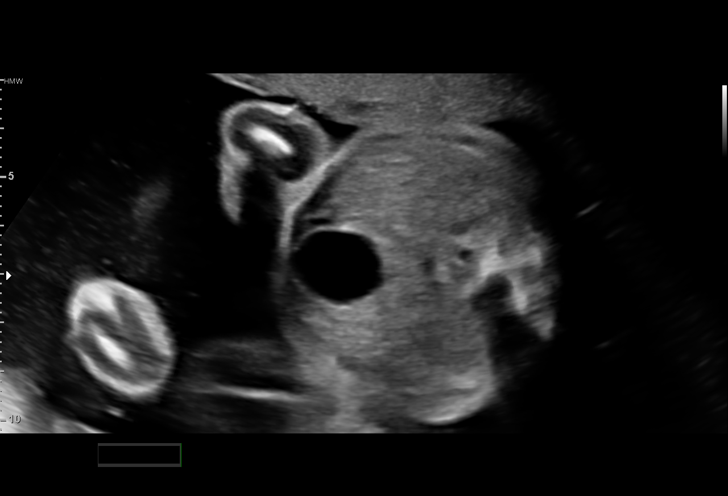
[im 25/36]
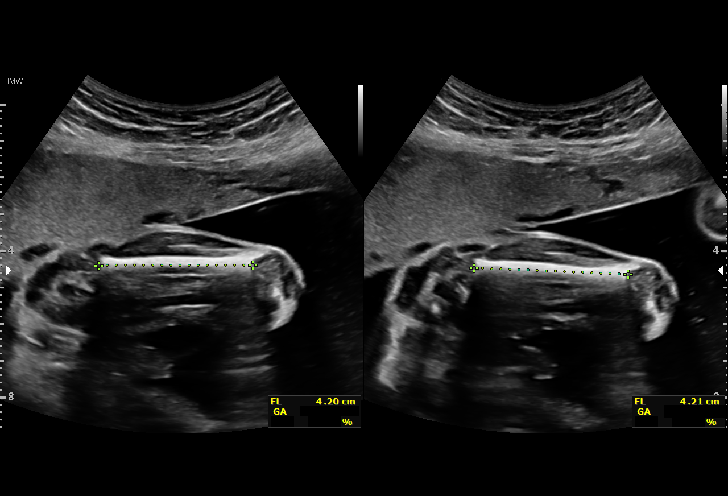
[im 28/36]
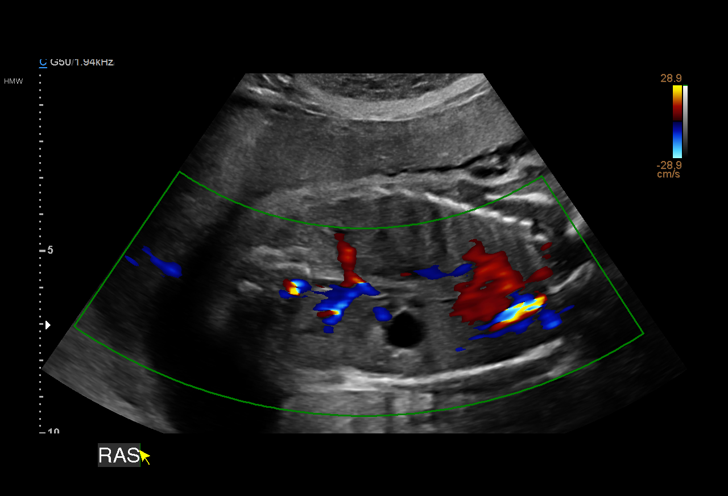
[im 30/36]
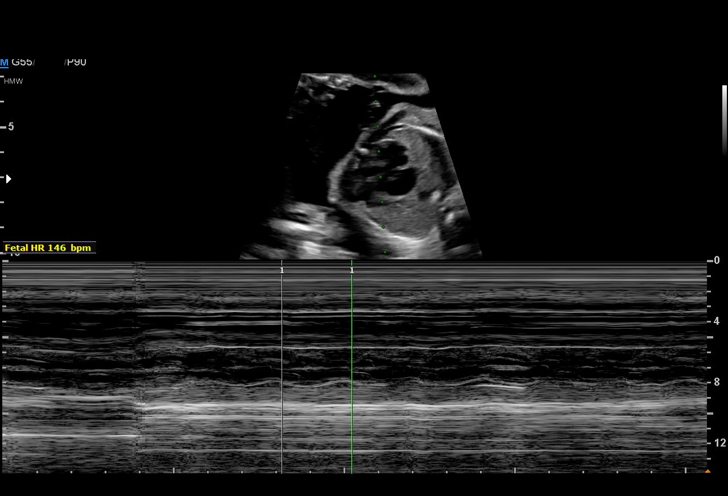
[im 33/36]
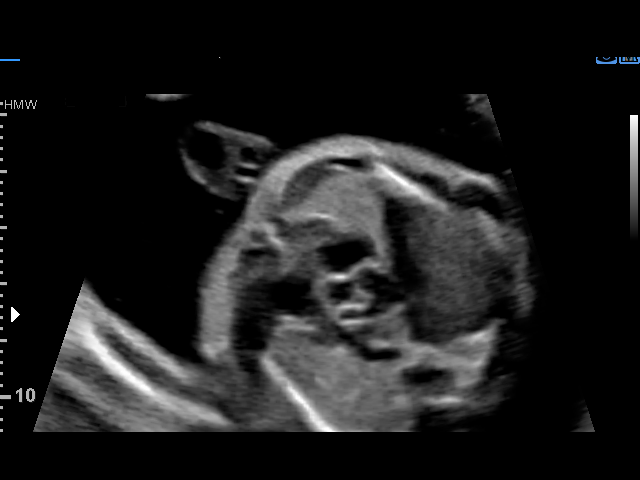
[im 36/36]
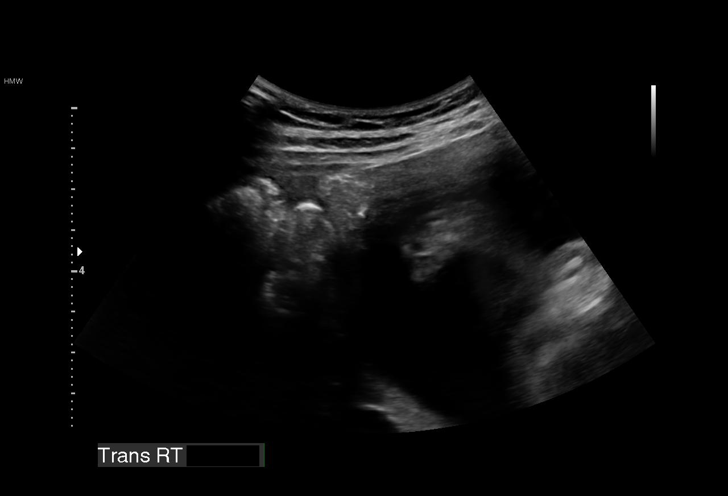

[14 of 28 positions shown; findings below may reference images not displayed]

FINDINGS: 1. Single intrauterine pregnancy.
2. Estimated fetal weight is in the 51st%.
3. Anterior placenta without evidence of previa.
4. Normal amniotic fluid volume.
5. Normal transabdominal cervical length.
6. The limited anatomy survey is normal.
Recommendations

1. Appropriate fetal growth.
2. Elevated MSAFP:
- previously counseled
- had normal anatomy survey
- reiterated increased risk of fetal growth restriction,
preeclampsia, preterm delivery, stillbirth, and abruption
- recommend serial fetal growth every 6 weeks (can be done
at [facility])
- recommend close surveillance for the development of
signs/symptoms of preeclampsia
3. Low risk first trimester screen

## 2017-07-21 ENCOUNTER — Encounter (HOSPITAL_COMMUNITY): Payer: Self-pay

## 2017-08-26 ENCOUNTER — Emergency Department (HOSPITAL_COMMUNITY)
Admission: EM | Admit: 2017-08-26 | Discharge: 2017-08-26 | Disposition: A | Discharge status: Other Acute Inpatient Hospital | Payer: Medicaid Other | Attending: Emergency Medicine | Admitting: Emergency Medicine

## 2017-08-26 ENCOUNTER — Encounter (HOSPITAL_COMMUNITY): Payer: Self-pay | Admitting: Emergency Medicine

## 2017-08-26 ENCOUNTER — Other Ambulatory Visit: Payer: Self-pay

## 2017-08-26 ENCOUNTER — Inpatient Hospital Stay (HOSPITAL_COMMUNITY)
Admission: AD | Admit: 2017-08-26 | Discharge: 2017-08-30 | DRG: 885 | Disposition: A | Discharge status: Home / Self Care | Payer: No Typology Code available for payment source | Source: Intra-hospital | Attending: Psychiatry | Admitting: Psychiatry

## 2017-08-26 ENCOUNTER — Encounter (HOSPITAL_COMMUNITY): Payer: Self-pay | Admitting: *Deleted

## 2017-08-26 DIAGNOSIS — Z886 Allergy status to analgesic agent status: Secondary | ICD-10-CM

## 2017-08-26 DIAGNOSIS — Z560 Unemployment, unspecified: Secondary | ICD-10-CM | POA: Diagnosis not present

## 2017-08-26 DIAGNOSIS — Z79899 Other long term (current) drug therapy: Secondary | ICD-10-CM | POA: Insufficient documentation

## 2017-08-26 DIAGNOSIS — F314 Bipolar disorder, current episode depressed, severe, without psychotic features: Secondary | ICD-10-CM | POA: Insufficient documentation

## 2017-08-26 DIAGNOSIS — R450 Nervousness: Secondary | ICD-10-CM | POA: Diagnosis not present

## 2017-08-26 DIAGNOSIS — Z87891 Personal history of nicotine dependence: Secondary | ICD-10-CM | POA: Insufficient documentation

## 2017-08-26 DIAGNOSIS — F331 Major depressive disorder, recurrent, moderate: Principal | ICD-10-CM | POA: Diagnosis present

## 2017-08-26 DIAGNOSIS — F410 Panic disorder [episodic paroxysmal anxiety] without agoraphobia: Secondary | ICD-10-CM | POA: Diagnosis not present

## 2017-08-26 DIAGNOSIS — F329 Major depressive disorder, single episode, unspecified: Secondary | ICD-10-CM

## 2017-08-26 DIAGNOSIS — Z818 Family history of other mental and behavioral disorders: Secondary | ICD-10-CM

## 2017-08-26 DIAGNOSIS — Z630 Problems in relationship with spouse or partner: Secondary | ICD-10-CM | POA: Diagnosis not present

## 2017-08-26 DIAGNOSIS — T424X2A Poisoning by benzodiazepines, intentional self-harm, initial encounter: Secondary | ICD-10-CM | POA: Diagnosis present

## 2017-08-26 DIAGNOSIS — R45851 Suicidal ideations: Secondary | ICD-10-CM | POA: Diagnosis not present

## 2017-08-26 DIAGNOSIS — F1290 Cannabis use, unspecified, uncomplicated: Secondary | ICD-10-CM | POA: Diagnosis not present

## 2017-08-26 DIAGNOSIS — F339 Major depressive disorder, recurrent, unspecified: Secondary | ICD-10-CM | POA: Diagnosis not present

## 2017-08-26 DIAGNOSIS — F4310 Post-traumatic stress disorder, unspecified: Secondary | ICD-10-CM | POA: Diagnosis present

## 2017-08-26 DIAGNOSIS — Z888 Allergy status to other drugs, medicaments and biological substances status: Secondary | ICD-10-CM | POA: Diagnosis not present

## 2017-08-26 DIAGNOSIS — G4700 Insomnia, unspecified: Secondary | ICD-10-CM | POA: Diagnosis not present

## 2017-08-26 DIAGNOSIS — Z811 Family history of alcohol abuse and dependence: Secondary | ICD-10-CM | POA: Diagnosis not present

## 2017-08-26 DIAGNOSIS — Z62810 Personal history of physical and sexual abuse in childhood: Secondary | ICD-10-CM | POA: Diagnosis present

## 2017-08-26 DIAGNOSIS — F603 Borderline personality disorder: Secondary | ICD-10-CM | POA: Insufficient documentation

## 2017-08-26 DIAGNOSIS — F1099 Alcohol use, unspecified with unspecified alcohol-induced disorder: Secondary | ICD-10-CM | POA: Diagnosis not present

## 2017-08-26 DIAGNOSIS — F3130 Bipolar disorder, current episode depressed, mild or moderate severity, unspecified: Secondary | ICD-10-CM | POA: Diagnosis present

## 2017-08-26 DIAGNOSIS — F419 Anxiety disorder, unspecified: Secondary | ICD-10-CM | POA: Diagnosis not present

## 2017-08-26 DIAGNOSIS — F319 Bipolar disorder, unspecified: Secondary | ICD-10-CM | POA: Diagnosis present

## 2017-08-26 LAB — ETHANOL: Alcohol, Ethyl (B): <10 mg/dL (ref <10)

## 2017-08-26 LAB — RAPID URINE DRUG SCREEN, HOSP PERFORMED
Amphetamines: NOT DETECTED
Barbiturates: NOT DETECTED
Benzodiazepines: POSITIVE — AB
COCAINE: NOT DETECTED
OPIATES: NOT DETECTED
TETRAHYDROCANNABINOL: POSITIVE — AB

## 2017-08-26 LAB — COMPREHENSIVE METABOLIC PANEL
ALBUMIN: 5.2 g/dL — AB (ref 3.5–5.0)
ALK PHOS: 65 U/L (ref 38–126)
ALT: 13 U/L — ABNORMAL LOW (ref 14–54)
ANION GAP: 10 (ref 5–15)
AST: 16 U/L (ref 15–41)
BUN: 13 mg/dL (ref 6–20)
CHLORIDE: 110 mmol/L (ref 101–111)
CO2: 20 mmol/L — AB (ref 22–32)
Calcium: 9.8 mg/dL (ref 8.9–10.3)
Creatinine, Ser: 0.82 mg/dL (ref 0.44–1.00)
GFR calc Af Amer: >60 mL/min (ref >60)
GFR calc non Af Amer: >60 mL/min (ref >60)
GLUCOSE: 89 mg/dL (ref 65–99)
POTASSIUM: 3.6 mmol/L (ref 3.5–5.1)
SODIUM: 140 mmol/L (ref 135–145)
Total Bilirubin: <0.1 mg/dL — ABNORMAL LOW (ref 0.3–1.2)
Total Protein: 7.9 g/dL (ref 6.5–8.1)

## 2017-08-26 LAB — CBC
HEMATOCRIT: 42.5 % (ref 36.0–46.0)
HEMOGLOBIN: 14.1 g/dL (ref 12.0–15.0)
MCH: 29.9 pg (ref 26.0–34.0)
MCHC: 33.2 g/dL (ref 30.0–36.0)
MCV: 90 fL (ref 78.0–100.0)
Platelets: 278 10*3/uL (ref 150–400)
RBC: 4.72 MIL/uL (ref 3.87–5.11)
RDW: 12.7 % (ref 11.5–15.5)
WBC: 11.7 10*3/uL — AB (ref 4.0–10.5)

## 2017-08-26 LAB — HCG, QUANTITATIVE, PREGNANCY: hCG, Beta Chain, Quant, S: <1 m[IU]/mL (ref <5)

## 2017-08-26 LAB — SALICYLATE LEVEL: Salicylate Lvl: <7 mg/dL (ref 2.8–30.0)

## 2017-08-26 LAB — ACETAMINOPHEN LEVEL

## 2017-08-26 MED ORDER — ACETAMINOPHEN 325 MG PO TABS
650.0000 mg | ORAL_TABLET | Freq: Four times a day (QID) | ORAL | Status: DC | PRN
  Administered 2017-08-29 – 2017-08-30 (×2): 650 mg via ORAL
  Filled 2017-08-26 (×2): qty 2

## 2017-08-26 MED ORDER — ACETAMINOPHEN 500 MG PO TABS
1000.0000 mg | ORAL_TABLET | Freq: Once | ORAL | Status: AC
  Administered 2017-08-26: 1000 mg via ORAL
  Filled 2017-08-26: qty 2

## 2017-08-26 MED ORDER — MAGNESIUM HYDROXIDE 400 MG/5ML PO SUSP: 30.0000 mL | Freq: Every day | ORAL | Status: DC | PRN

## 2017-08-26 MED ORDER — HYDROXYZINE HCL 25 MG PO TABS
25.0000 mg | ORAL_TABLET | Freq: Three times a day (TID) | ORAL | Status: DC | PRN
  Administered 2017-08-26 – 2017-08-28 (×3): 25 mg via ORAL
  Filled 2017-08-26: qty 1
  Filled 2017-08-26: qty 10
  Filled 2017-08-26 (×3): qty 1

## 2017-08-26 MED ORDER — ALUM & MAG HYDROXIDE-SIMETH 200-200-20 MG/5ML PO SUSP
30.0000 mL | ORAL | Status: DC | PRN
Start: 2017-08-26 — End: 2017-08-30

## 2017-08-26 MED ORDER — TRAZODONE HCL 50 MG PO TABS
50.0000 mg | ORAL_TABLET | Freq: Every evening | ORAL | Status: DC | PRN
  Administered 2017-08-26 – 2017-08-29 (×4): 50 mg via ORAL
  Filled 2017-08-26: qty 7
  Filled 2017-08-26 (×4): qty 1

## 2017-08-26 NOTE — ED Triage Notes (Signed)
Patient tearful in triage, states "I'm depressed." Per mother, patient's spouse left her today, she stopped taking her lexapro, and has had SI x 3 weeks. Patient admits to [facility] in triage, states "I tried to take a handful of ativan but I only got two down."

## 2017-08-26 NOTE — Tx Team (Signed)
Initial Treatment Plan 08/26/2017 11:58 PM Rayli Mckinley Jewel ZOX:[id number]    PATIENT STRESSORS: Marital or family conflict Medication change or noncompliance   PATIENT STRENGTHS: Ability for insight Average or above average intelligence Capable of independent living Communication skills General fund of knowledge   PATIENT IDENTIFIED PROBLEMS: Depression Anxiety Suicidal thoughts "Get the right medicine and the right combination of medicine"                     DISCHARGE CRITERIA:  Ability to meet basic life and health needs Improved stabilization in mood, thinking, and/or behavior Reduction of life-threatening or endangering symptoms to within safe limits Verbal commitment to aftercare and medication compliance  PRELIMINARY DISCHARGE PLAN: Attend aftercare/continuing care group Return to previous living arrangement  PATIENT/FAMILY INVOLVEMENT: This treatment plan has been presented to and reviewed with the patient, [employer], and/or family member, .  The patient and family have been given the opportunity to ask questions and make suggestions.  Aaryana Betke, [place], [place] 08/26/2017, 11:58 PM

## 2017-08-26 NOTE — ED Provider Notes (Signed)
[facility] EMERGENCY DEPARTMENT Provider Note   CSN: [id number] Arrival date & time: 08/26/17  1219     History   Chief Complaint Chief Complaint  Patient presents with  . V70.1    HPI Amy Whitaker is a 22 y.o. female.  Chief complaint is anxiety, depression, suicidal ideations  HPI: 22 year old female.  Married, has 2 kids at home, 5 years, and 4 months.  Has been suffering with depression for several years.  Was placed on Lexapro by her primary care physician about 4 to 6 weeks ago.  Took it for about 2 to 3 weeks.  She felt she was making her too sleepy.  She states her husband complained that she was sleeping "all the time".  So she stopped it over 2 weeks ago.  Increasing anxiety.  Had an emotional crisis today at home after taking her child to school.  She states that she took 2 of her Ativan.  She states her husband took the rest of her medication out of concern she was going to take it and she states that "I am so anxious and depressed I might as well not be here".  Presents her with her mother who is supportive.  Patient is distraught emotional and crying  Past Medical History:  Diagnosis Date  . Anxiety   . Depression   . Urticaria     Patient Active Problem List   Diagnosis Date Noted  . Urticaria 12/01/2015  . Anxiety 12/01/2015    Past Surgical History:  Procedure Laterality Date  . NO PAST SURGERIES       OB History    Gravida  2   Para  1   Term  1   Preterm      AB      Living  1     SAB      TAB      Ectopic      Multiple      Live Births               Home Medications    Prior to Admission medications   Medication Sig Start Date End Date Taking? Authorizing Provider  acetaminophen (TYLENOL) 325 MG tablet Take 325 mg by mouth every 6 (six) hours as needed for moderate pain, fever or headache.   Yes [provider]  escitalopram (LEXAPRO) 10 MG tablet Take 10 mg by mouth daily.   Yes [provider]    LORazepam (ATIVAN) 0.5 MG tablet Take 0.5 mg by mouth every 8 (eight) hours as needed for anxiety.   Yes [provider]  promethazine (PHENERGAN) 25 MG tablet Take 1 tablet (25 mg total) by mouth every 6 (six) hours as needed. 11/09/16   Donnetta Hutching, MD    Family History Family History  Problem Relation Age of Onset  . Asthma Maternal Grandmother   . COPD Maternal Grandmother   . Eczema Maternal Grandmother   . Allergic rhinitis Maternal Grandmother   . Eczema Maternal Grandfather   . Angioedema Neg Hx   . Immunodeficiency Neg Hx   . Urticaria Neg Hx     Social History Social History   Tobacco Use  . Smoking status: Former Games developer  . Smokeless tobacco: Never Used  Substance Use Topics  . Alcohol use: No  . Drug use: Yes    Types: Marijuana     Allergies   Bupropion and Ibuprofen   Review of Systems Review of Systems  Constitutional: Positive for  appetite change. Negative for chills, diaphoresis, fatigue and fever.  HENT: Negative for mouth sores, sore throat and trouble swallowing.   Eyes: Negative for visual disturbance.  Respiratory: Negative for cough, chest tightness, shortness of breath and wheezing.   Cardiovascular: Negative for chest pain.  Gastrointestinal: Negative for abdominal distention, abdominal pain, diarrhea, nausea and vomiting.  Endocrine: Negative for polydipsia, polyphagia and polyuria.  Genitourinary: Negative for dysuria, frequency and hematuria.  Musculoskeletal: Negative for gait problem.  Skin: Negative for color change, pallor and rash.  Neurological: Negative for dizziness, syncope, light-headedness and headaches.  Hematological: Does not bruise/bleed easily.  Psychiatric/Behavioral: Positive for dysphoric mood, sleep disturbance and suicidal ideas. Negative for behavioral problems and confusion. The patient is nervous/anxious.      Physical Exam Updated Vital Signs BP (!) 131/100 (BP Location: Right Arm)   Pulse (!) 119    Temp 97.8 F (36.6 C) (Temporal)   Resp (!) 26 Comment: pt crying  Wt 46.9 kg (103 lb 4.8 oz)   LMP 06/26/2017   SpO2 100%   BMI 19.52 kg/m   Physical Exam  Constitutional: She is oriented to person, place, and time. No distress.  Awake and alert.  Intermittently crying and sobbing.  HENT:  Head: Normocephalic.  Eyes: Pupils are equal, round, and reactive to light. Conjunctivae are normal. No scleral icterus.  Neck: Normal range of motion. Neck supple. No thyromegaly present.  Cardiovascular: Normal rate and regular rhythm. Exam reveals no gallop and no friction rub.  No murmur heard. Pulmonary/Chest: Effort normal and breath sounds normal. No respiratory distress. She has no wheezes. She has no rales.  Abdominal: Soft. Bowel sounds are normal. She exhibits no distension. There is no tenderness. There is no rebound.  Musculoskeletal: Normal range of motion.  Neurological: She is alert and oriented to person, place, and time.  Skin: Skin is warm and dry. No rash noted.  Psychiatric:  Anxious.  Sobbing.     ED Treatments / Results  Labs (all labs ordered are listed, but only abnormal results are displayed) Labs Reviewed  COMPREHENSIVE METABOLIC PANEL - Abnormal; Notable for the following components:      Result Value   CO2 20 (*)    Albumin 5.2 (*)    ALT 13 (*)    Total Bilirubin <0.1 (*)    All other components within normal limits  ACETAMINOPHEN LEVEL - Abnormal; Notable for the following components:   Acetaminophen (Tylenol), Serum <10 (*)    All other components within normal limits  CBC - Abnormal; Notable for the following components:   WBC 11.7 (*)    All other components within normal limits  RAPID URINE DRUG SCREEN, HOSP PERFORMED - Abnormal; Notable for the following components:   Benzodiazepines POSITIVE (*)    Tetrahydrocannabinol POSITIVE (*)    All other components within normal limits  ETHANOL  SALICYLATE LEVEL  HCG, QUANTITATIVE, PREGNANCY     EKG None  Radiology No results found.  Procedures Procedures (including critical care time)  Medications Ordered in ED Medications  acetaminophen (TYLENOL) tablet 1,000 mg (1,000 mg Oral Given 08/26/17 1304)     Initial Impression / Assessment and Plan / ED Course  I have reviewed the triage vital signs and the nursing notes.  Pertinent labs & imaging results that were available during my care of the patient were reviewed by me and considered in my medical decision making (see chart for details).   Medical screening exam performed.  No acute medical crisis  noted.  Await TTS evaluation.  Patient may need hospitalization for stabilization of her depression and anxiety.  No sign of toxidrome or symptomatic ingestion  Final Clinical Impressions(s) / ED Diagnoses   Final diagnoses:  Major depressive disorder, remission status unspecified, unspecified whether recurrent  Anxiety  Verbalizes suicidal thoughts    ED Discharge Orders    None       Rolland Porter, MD 08/26/17 1402

## 2017-08-26 NOTE — ED Notes (Signed)
ED Provider at bedside. 

## 2017-08-26 NOTE — ED Notes (Signed)
[facility] here for transport.

## 2017-08-26 NOTE — ED Notes (Signed)
Security wanded pt after changing into paper scrubs.
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
## 2017-08-27 DIAGNOSIS — R45851 Suicidal ideations: Secondary | ICD-10-CM

## 2017-08-27 DIAGNOSIS — F1099 Alcohol use, unspecified with unspecified alcohol-induced disorder: Secondary | ICD-10-CM

## 2017-08-27 DIAGNOSIS — F1290 Cannabis use, unspecified, uncomplicated: Secondary | ICD-10-CM

## 2017-08-27 DIAGNOSIS — Z811 Family history of alcohol abuse and dependence: Secondary | ICD-10-CM

## 2017-08-27 DIAGNOSIS — F419 Anxiety disorder, unspecified: Secondary | ICD-10-CM

## 2017-08-27 DIAGNOSIS — Z560 Unemployment, unspecified: Secondary | ICD-10-CM

## 2017-08-27 DIAGNOSIS — Z818 Family history of other mental and behavioral disorders: Secondary | ICD-10-CM

## 2017-08-27 DIAGNOSIS — F319 Bipolar disorder, unspecified: Secondary | ICD-10-CM

## 2017-08-27 DIAGNOSIS — F410 Panic disorder [episodic paroxysmal anxiety] without agoraphobia: Secondary | ICD-10-CM

## 2017-08-27 DIAGNOSIS — R450 Nervousness: Secondary | ICD-10-CM

## 2017-08-27 DIAGNOSIS — Z62810 Personal history of physical and sexual abuse in childhood: Secondary | ICD-10-CM

## 2017-08-27 MED ORDER — VENLAFAXINE HCL ER 37.5 MG PO CP24
37.5000 mg | ORAL_CAPSULE | Freq: Every day | ORAL | Status: DC
  Administered 2017-08-27 – 2017-08-28 (×2): 37.5 mg via ORAL
  Filled 2017-08-27 (×6): qty 1

## 2017-08-27 NOTE — Progress Notes (Signed)
Patient ID: Amy Whitaker, female   DOB: May 29, 1995, 22 y.o.   MRN: [id number]  Pt currently presents with a labile affect and restless behavior. Endorses ongoing anxiety. Pt reports to writer that their goal is to "call my husband and mother in law so I can talk to my kids." Pt makes multiple blaming statements towards other during assessment. Pt also overheard endorsing helplessness and that "they always tell me therapy, it never works..." Pt reports good sleep with current medication regimen.   Pt provided with medications per providers orders. Pt's labs and vitals were monitored throughout the night. Pt given a 1:1 about emotional and mental status. Pt supported and encouraged to express concerns and questions. Pt educated on medications and assertiveness techniques.   Pt's safety ensured with 15 minute and environmental checks. Pt currently denies SI/HI and A/V hallucinations. Pt verbally agrees to seek staff if SI/HI or A/VH occurs and to consult with staff before acting on any harmful thoughts. Will continue POC.

## 2017-08-27 NOTE — Progress Notes (Signed)
Recreation Therapy Notes  Animal-Assisted Activity (AAA) Program Checklist/Progress Notes Patient Eligibility Criteria Checklist & Daily Group note for Rec Tx Intervention  Date: 5.14.19 Time: 1430 Location: 400 Hall Dayroom   AAA/T Program Assumption of Risk Form signed by Patient/ or Parent Legal Guardian YES   Patient is free of allergies or sever asthma NO  Patient reports no fear of animals YES   Patient reports no history of cruelty to animals YES   Patient understands his/her participation is voluntary YES   Patient washes hands before animal contact YES   Patient washes hands after animal contact YES   Behavioral Response: Engaged  Education: Hand Washing, Appropriate Animal Interaction   Education Outcome: Acknowledges understanding/In group clarification offered/Needs additional education.   Clinical Observations/Feedback: Pt attended and participated in activity.    Hanadi Stanly, LRT/CTRS         Carsen Machi A 08/27/2017 3:44 PM 

## 2017-08-27 NOTE — BHH Suicide Risk Assessment (Signed)
[facility] INPATIENT:  Family/Significant Other Suicide Prevention Education  Suicide Prevention Education:  Patient Refusal for Family/Significant Other Suicide Prevention Education: The patient [name] has refused to provide written consent for family/significant other to be provided Family/Significant Other Suicide Prevention Education during admission and/or prior to discharge.  Physician notified.  SPE completed with patient, as patient refused to consent to family contact. SPI pamphlet provided to pt and pt was encouraged to share information with support network, ask questions, and talk about any concerns relating to SPE. Patient denies access to guns/firearms and verbalized understanding of information provided. Mobile Crisis information also provided to patient.   Amy Whitaker 08/27/2017, 3:01 PM

## 2017-08-27 NOTE — Progress Notes (Signed)
Amy Whitaker is a 22 year old female pt admitted on voluntary basis. On admission, Amy Whitaker appears anxious and tearful and she spoke about how she got into an argument with her husband and reports that she took 2 ativan. She spoke about on-going mental health issues and reports a lot of the relationship issues are because of her mental health problems. She reports that she sees a Dr. Paulina Fusi for medication management and spoke about how she was taking ativan and lexapro and spoke about how she went cold [place] on the lexapro because it was making her sleep too much. She denies any current SI on admission and is able to contract for safety while in the hospital. She reports that she lives with her husband and their 2 children and reports that she will go back there once she is discharged. Amy Whitaker was oriented to the unit and safety maintained.
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
## 2017-08-27 NOTE — BHH Suicide Risk Assessment (Signed)
[facility] Admission Suicide Risk Assessment   Nursing information obtained from:   patient and chart  Demographic factors:   22 year old married female, two children Current Mental Status:   see below Loss Factors:   marital discord  Historical Factors:   history of mood disorder  Risk Reduction Factors:   resilience, sense of responsibility to children  Total Time spent with patient: 45 minutes Principal Problem: Diagnosis:   Patient Active Problem List   Diagnosis Date Noted  . Bipolar 1 disorder, depressed (HCC) [F31.9] 08/26/2017  . Urticaria [L50.9] 12/01/2015  . Anxiety [F41.9] 12/01/2015     Continued Clinical Symptoms:  Alcohol Use Disorder Identification Test Final Score (AUDIT): 2 The "Alcohol Use Disorders Identification Test", Guidelines for Use in Primary Care, Second Edition.  World Science writer [facility]). Score between 0-7:  no or low risk or alcohol related problems. Score between 8-15:  moderate risk of alcohol related problems. Score between 16-19:  high risk of alcohol related problems. Score 20 or above:  warrants further diagnostic evaluation for alcohol dependence and treatment.   CLINICAL FACTORS:  22 year old married female, two children ages 52, 26 months, presented to hospital with mother due to worsening depression, suicidal ideations with thoughts of overdosing on Ativan. She reports history of anxiety, and describes history/symptoms suggestive of GAD. Also endorses history of PTSD, improving overtime. Recently tried Lexapro, but caused excessive sedation.   Psychiatric Specialty Exam: Physical Exam  ROS  Blood pressure (!) 87/69, pulse 94, temperature 98.1 F (36.7 C), temperature source Oral, resp. rate 12, height 5' (1.524 m), weight 46.3 kg (102 lb), unknown if currently breastfeeding.Body mass index is 19.92 kg/m.  See admit note MSE   COGNITIVE FEATURES THAT CONTRIBUTE TO RISK:  Closed-mindedness and Loss of executive function    SUICIDE RISK:    Moderate:  Frequent suicidal ideation with limited intensity, and duration, some specificity in terms of plans, no associated intent, good self-control, limited dysphoria/symptomatology, some risk factors present, and identifiable protective factors, including available and accessible social support.  PLAN OF CARE: Patient will be admitted to inpatient psychiatric unit for stabilization and safety. Will provide and encourage milieu participation. Provide medication management and maked adjustments as needed.  Will follow daily.    I certify that inpatient services furnished can reasonably be expected to improve the patient's condition.   Craige Cotta, MD 08/27/2017, 12:37 PM

## 2017-08-27 NOTE — Plan of Care (Signed)
  Problem: Safety: Goal: Ability to disclose and discuss suicidal ideas will improve Outcome: Progressing   Problem: Medication: Goal: Compliance with prescribed medication regimen will improve Outcome: Progressing  DAR NOTE: Patient presents with bright affect and pleasant mood.  Denies suicidal thoughts, pain, auditory and visual hallucinations.  Described energy level as low and concentration as good.  Rates depression at 4, hopelessness at 0, and anxiety at 2.  Maintained on routine safety checks.  Medications given as prescribed.  Support and encouragement offered as needed.  Attended group and participated.  States goal for today is "start the right medication."  Patient observed socializing with peers in the dayroom.  Offered no complaint.

## 2017-08-27 NOTE — [facility] Group Notes (Signed)
[facility] Group Therapy      08/27/2017 2:06 PM  Type of Therapy: [facility] Presentation  Participation Level: DID NOT ATTEND    Summary of Progress/Problems:   INVITED, CHOSE NOT TO ATTEND.    Alcario Drought Clinical Social Worker
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
## 2017-08-27 NOTE — Progress Notes (Signed)
Pt visible in the milieu.  Interacting appropriately with staff and peers.  Needs assessed.  Pt denied.  Pt denied SI, HI, AVH.  Pt reported the medications prescribed are helping to reduce her anxiety.  Fifteen minute checks in progress for patient safety.  Pt safe on unit.

## 2017-08-28 DIAGNOSIS — G4700 Insomnia, unspecified: Secondary | ICD-10-CM

## 2017-08-28 DIAGNOSIS — Z87891 Personal history of nicotine dependence: Secondary | ICD-10-CM

## 2017-08-28 LAB — TSH: TSH: 2.132 u[IU]/mL (ref 0.350–4.500)

## 2017-08-28 MED ORDER — VENLAFAXINE HCL ER 75 MG PO CP24
75.0000 mg | ORAL_CAPSULE | Freq: Every day | ORAL | Status: DC
  Administered 2017-08-29 – 2017-08-30 (×2): 75 mg via ORAL
  Filled 2017-08-28: qty 1
  Filled 2017-08-28: qty 7
  Filled 2017-08-28 (×2): qty 1

## 2017-08-28 NOTE — Progress Notes (Signed)
Adult Psychoeducational Group Note  Date:  08/28/2017 Time:  9:45 AM  Group Topic/Focus:  Goals Group:   The focus of this group is to help patients establish daily goals to achieve during treatment and discuss how the patient can incorporate goal setting into their daily lives to aide in recovery.  Participation Level:  Active  Participation Quality:  Appropriate  Affect:  Appropriate  Cognitive:  Appropriate  Insight: Appropriate  Engagement in Group:  Engaged  Modes of Intervention:  Discussion  Additional Comments:  Pt attended group and had a personal goal to work on her self care.   [name] Amy Whitaker 08/28/2017, 9:45 AM

## 2017-08-28 NOTE — Progress Notes (Signed)
[facility] MD Progress Note  08/28/2017 3:14 PM Amy Whitaker  MRN:  [id number] Subjective:  Patient reports she is feeling better today , and states that her family pointed out that she appears improved and more her usual self as well . Denies suicidal ideations. Denies medication side effects and currently tolerating antidepressant trial ( Effexor XR ) well , without side effects. Objective : I have discussed case with treatment team and have met with patient . 22 year old married female, presented to hospital due to worsening depression, suicidal ideations, anxiety. She had stopped Lexapro trial recently due to feeling excessively sedated on this medication and reports mood had deteriorated further after stopping it . Currently on Effexor XR trial, tolerating well thus far and denies side effects. States she feels optimistic that this medication will help. We have reviewed side effect profile including potential risk of Venlafaxine WDL if stopped abruptly. Visible on unit, interactive with peers, no disruptive or agitated behaviors. Labs- TSH 2.13   Principal Problem:Depresion, Anxiety   Diagnosis:   Patient Active Problem List   Diagnosis Date Noted  . Bipolar 1 disorder, depressed ([place]) [F31.9] 08/26/2017  . Urticaria [L50.9] 12/01/2015  . Anxiety [F41.9] 12/01/2015   Total Time spent with patient: 20 minutes  Past Medical History:  Past Medical History:  Diagnosis Date  . Anxiety   . Depression   . Urticaria     Past Surgical History:  Procedure Laterality Date  . NO PAST SURGERIES     Family History:  Family History  Problem Relation Age of Onset  . Asthma Maternal Grandmother   . COPD Maternal Grandmother   . Eczema Maternal Grandmother   . Allergic rhinitis Maternal Grandmother   . Eczema Maternal Grandfather   . Angioedema Neg Hx   . Immunodeficiency Neg Hx   . Urticaria Neg Hx    Social History:  Social History   Substance and Sexual Activity  Alcohol Use Yes      Social History   Substance and Sexual Activity  Drug Use Yes  . Types: Marijuana    Social History   Socioeconomic History  . Marital status: Married    Spouse name: Not on file  . Number of children: Not on file  . Years of education: Not on file  . Highest education level: Not on file  Occupational History  . Not on file  Social Needs  . Financial resource strain: Not on file  . Food insecurity:    Worry: Not on file    Inability: Not on file  . Transportation needs:    Medical: Not on file    Non-medical: Not on file  Tobacco Use  . Smoking status: Former Research scientist (life sciences)  . Smokeless tobacco: Never Used  Substance and Sexual Activity  . Alcohol use: Yes  . Drug use: Yes    Types: Marijuana  . Sexual activity: Yes    Birth control/protection: None  Lifestyle  . Physical activity:    Days per week: Not on file    Minutes per session: Not on file  . Stress: Not on file  Relationships  . Social connections:    Talks on phone: Not on file    Gets together: Not on file    Attends religious service: Not on file    Active member of club or organization: Not on file    Attends meetings of clubs or organizations: Not on file    Relationship status: Not on file  Other Topics  Concern  . Not on file  Social History Narrative  . Not on file   Additional Social History:   Sleep: improving   Appetite:  improving   Current Medications: Current Facility-Administered Medications  Medication Dose Route Frequency Provider Last Rate Last Dose  . acetaminophen (TYLENOL) tablet 650 mg  650 mg Oral Q6H PRN Niel Hummer, NP      . alum & mag hydroxide-simeth (MAALOX/MYLANTA) 200-200-20 MG/5ML suspension 30 mL  30 mL Oral Q4H PRN Elmarie Shiley A, NP      . hydrOXYzine (ATARAX/VISTARIL) tablet 25 mg  25 mg Oral TID PRN Niel Hummer, NP   25 mg at 08/28/17 1037  . magnesium hydroxide (MILK OF MAGNESIA) suspension 30 mL  30 mL Oral Daily PRN Elmarie Shiley A, NP      . traZODone  (DESYREL) tablet 50 mg  50 mg Oral QHS PRN Niel Hummer, NP   50 mg at 08/27/17 2238  . venlafaxine XR (EFFEXOR-XR) 24 hr capsule 37.5 mg  37.5 mg Oral Q breakfast Cobos, Myer Peer, MD   37.5 mg at 08/28/17 4268    Lab Results:  Results for orders placed or performed during the hospital encounter of 08/26/17 (from the past 48 hour(s))  TSH     Status: None   Collection Time: 08/28/17  6:30 AM  Result Value Ref Range   TSH 2.132 0.350 - 4.500 uIU/mL    Comment: Performed by a 3rd Generation assay with a functional sensitivity of <=0.01 uIU/mL. Performed at [facility], [place] [address], [place], [place] [address]     Blood Alcohol level:  Lab Results  Component Value Date   [facility] <10 08/26/2017   ETH <11 22/29/7989    Metabolic Disorder Labs: No results found for: HGBA1C, MPG No results found for: PROLACTIN No results found for: CHOL, TRIG, HDL, CHOLHDL, VLDL, LDLCALC  Physical Findings: AIMS: Facial and Oral Movements Muscles of Facial Expression: None, normal Lips and Perioral Area: None, normal Jaw: None, normal Tongue: None, normal,Extremity Movements Upper (arms, wrists, hands, fingers): None, normal Lower (legs, knees, ankles, toes): None, normal, Trunk Movements Neck, shoulders, hips: None, normal, Overall Severity Severity of abnormal movements (highest score from questions above): None, normal Incapacitation due to abnormal movements: None, normal Patient's awareness of abnormal movements (rate only patient's report): No Awareness, Dental Status Current problems with teeth and/or dentures?: No Does patient usually wear dentures?: No  CIWA:    COWS:     Musculoskeletal: Strength & Muscle Tone: within normal limits Gait & Station: normal Patient leans: N/A  Psychiatric Specialty Exam: Physical Exam  ROS denies headache, no chest pain, no shortness of breath, no vomiting   Blood pressure 93/76, pulse (!) 110, temperature 98.1 F (36.7 C),  temperature source Oral, resp. rate 20, height 5' (1.524 m), weight 46.3 kg (102 lb), unknown if currently breastfeeding.Body mass index is 19.92 kg/m.  General Appearance: improving grooming   Eye Contact:  Good  Speech:  Normal Rate  Volume:  Normal  Mood:  improving mood, states she feels better than on admission  Affect:  appropriate, more reactive  Thought Process:  Linear and Descriptions of Associations: Intact  Orientation:  Full (Time, Place, and Person)  Thought Content:  denies hallucinations, no delusions,not internally preoccupied  Suicidal Thoughts:  No denies suicidal or self injurious ideations , denies homicidal or violent ideations  Homicidal Thoughts:  No  Memory:  recent and remote grossly intact  Judgement:  Other:  improving   Insight:  improving   Psychomotor Activity:  Normal  Concentration:  Concentration: Good and Attention Span: Good  Recall:  Good  Fund of Knowledge:  Good  Language:  Good  Akathisia:  Negative  Handed:  Right  AIMS (if indicated):     Assets:  Communication Skills Desire for Improvement Resilience  ADL's:  Intact  Cognition:  WNL  Sleep:  Number of Hours: 5.75   Assessment - patient reports improving mood, and presents with a fuller range of affect. Denies SI at this time. Visible on unit and interactive with peers. Thus far tolerating Effexor XR trial well .   Treatment Plan Summary: Daily contact with patient to assess and evaluate symptoms and progress in treatment, Medication management, Plan inpatient treatment  and medications as below  Encourage group and milieu participation to work on coping skill and symptom reduction Increase Effexor XR to 75 mgrs QDAY for depression, anxiety Continue Trazodone 50 mgrs QHS for insomnia as needed Continue Vistaril 25 mgr Q 6 hours PRN for anxiety Treatment team working on disposition planning options  Jenne Campus, MD 08/28/2017, 3:14 PM

## 2017-08-28 NOTE — Progress Notes (Signed)
Recreation Therapy Notes  Date: 5.15.19 Time: 0930 Location: [address] Dayroom  Group Topic: Stress Management  Goal Area(s) Addresses:  Patient will verbalize importance of using healthy stress management.  Patient will identify positive emotions associated with healthy stress management.   Intervention: Stress Management  Activity :  Meditation.  LRT played a meditation on being resilient in the face of adversity.  Patients were to follow along as the meditation played.   Education:  Stress Management, Discharge Planning.   Education Outcome: Acknowledges edcuation/In group clarification offered/Needs additional education  Clinical Observations/Feedback: Pt did not attend group.    Oskar Cretella, LRT/CTRS         Odena Mcquaid A 08/28/2017 11:09 AM 
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
## 2017-08-28 NOTE — Tx Team (Signed)
Interdisciplinary Treatment and Diagnostic Plan Update  08/28/2017 Time of Session: 10:00am Monita Swier MRN: [id number]  Principal Diagnosis: <principal problem not specified>  Secondary Diagnoses: Active Problems:   Bipolar 1 disorder, depressed ([facility])   Current Medications:  Current Facility-Administered Medications  Medication Dose Route Frequency Provider Last Rate Last Dose  . acetaminophen (TYLENOL) tablet 650 mg  650 mg Oral Q6H PRN Niel Hummer, NP      . alum & mag hydroxide-simeth (MAALOX/MYLANTA) 200-200-20 MG/5ML suspension 30 mL  30 mL Oral Q4H PRN Elmarie Shiley A, NP      . hydrOXYzine (ATARAX/VISTARIL) tablet 25 mg  25 mg Oral TID PRN Niel Hummer, NP   25 mg at 08/28/17 1037  . magnesium hydroxide (MILK OF MAGNESIA) suspension 30 mL  30 mL Oral Daily PRN Elmarie Shiley A, NP      . traZODone (DESYREL) tablet 50 mg  50 mg Oral QHS PRN Niel Hummer, NP   50 mg at 08/27/17 2238  . [START ON 08/29/2017] venlafaxine XR (EFFEXOR-XR) 24 hr capsule 75 mg  75 mg Oral Q breakfast Cobos, Myer Peer, MD       PTA Medications: Medications Prior to Admission  Medication Sig Dispense Refill Last Dose  . acetaminophen (TYLENOL) 325 MG tablet Take 325 mg by mouth every 6 (six) hours as needed for moderate pain, fever or headache.   Past Week at Unknown time  . escitalopram (LEXAPRO) 10 MG tablet Take 10 mg by mouth daily.   Past Month at Unknown time  . LORazepam (ATIVAN) 0.5 MG tablet Take 0.5 mg by mouth every 8 (eight) hours as needed for anxiety.   08/25/2017 at Unknown time  . promethazine (PHENERGAN) 25 MG tablet Take 1 tablet (25 mg total) by mouth every 6 (six) hours as needed. 20 tablet 0 Taking    Patient Stressors: Marital or family conflict Medication change or noncompliance  Patient Strengths: Ability for insight Average or above average intelligence Capable of independent living Communication skills General fund of knowledge  Treatment Modalities: Medication  Management, Group therapy, Case management,  1 to 1 session with clinician, Psychoeducation, Recreational therapy.   Physician Treatment Plan for Primary Diagnosis: <principal problem not specified> Long Term Goal(s): Improvement in symptoms so as ready for discharge Improvement in symptoms so as ready for discharge   Short Term Goals: Ability to identify changes in lifestyle to reduce recurrence of condition will improve Ability to maintain clinical measurements within normal limits will improve Ability to identify changes in lifestyle to reduce recurrence of condition will improve Ability to verbalize feelings will improve Ability to disclose and discuss suicidal ideas Ability to demonstrate self-control will improve Ability to identify and develop effective coping behaviors will improve Ability to maintain clinical measurements within normal limits will improve  Medication Management: Evaluate patient's response, side effects, and tolerance of medication regimen.  Therapeutic Interventions: 1 to 1 sessions, Unit Group sessions and Medication administration.  Evaluation of Outcomes: Not Met  Physician Treatment Plan for Secondary Diagnosis: Active Problems:   Bipolar 1 disorder, depressed ([place])  Long Term Goal(s): Improvement in symptoms so as ready for discharge Improvement in symptoms so as ready for discharge   Short Term Goals: Ability to identify changes in lifestyle to reduce recurrence of condition will improve Ability to maintain clinical measurements within normal limits will improve Ability to identify changes in lifestyle to reduce recurrence of condition will improve Ability to verbalize feelings will improve Ability to disclose and  discuss suicidal ideas Ability to demonstrate self-control will improve Ability to identify and develop effective coping behaviors will improve Ability to maintain clinical measurements within normal limits will improve     Medication  Management: Evaluate patient's response, side effects, and tolerance of medication regimen.  Therapeutic Interventions: 1 to 1 sessions, Unit Group sessions and Medication administration.  Evaluation of Outcomes: Not Met   RN Treatment Plan for Primary Diagnosis: <principal problem not specified> Long Term Goal(s): Knowledge of disease and therapeutic regimen to maintain health will improve  Short Term Goals: Ability to remain free from injury will improve, Ability to verbalize frustration and anger appropriately will improve, Ability to demonstrate self-control, Ability to participate in decision making will improve, Ability to verbalize feelings will improve, Ability to disclose and discuss suicidal ideas, Ability to identify and develop effective coping behaviors will improve and Compliance with prescribed medications will improve  Medication Management: RN will administer medications as ordered by provider, will assess and evaluate patient's response and provide education to patient for prescribed medication. RN will report any adverse and/or side effects to prescribing provider.  Therapeutic Interventions: 1 on 1 counseling sessions, Psychoeducation, Medication administration, Evaluate responses to treatment, Monitor vital signs and CBGs as ordered, Perform/monitor CIWA, COWS, AIMS and Fall Risk screenings as ordered, Perform wound care treatments as ordered.  Evaluation of Outcomes: Not Met   LCSW Treatment Plan for Primary Diagnosis: <principal problem not specified> Long Term Goal(s): Safe transition to appropriate next level of care at discharge, Engage patient in therapeutic group addressing interpersonal concerns.  Short Term Goals: Engage patient in aftercare planning with referrals and resources, Increase social support, Increase ability to appropriately verbalize feelings, Increase emotional regulation, Facilitate acceptance of mental health diagnosis and concerns, Facilitate  patient progression through stages of change regarding substance use diagnoses and concerns, Identify triggers associated with mental health/substance abuse issues and Increase skills for wellness and recovery  Therapeutic Interventions: Assess for all discharge needs, 1 to 1 time with Social worker, Explore available resources and support systems, Assess for adequacy in community support network, Educate family and significant other(s) on suicide prevention, Complete Psychosocial Assessment, Interpersonal group therapy.  Evaluation of Outcomes: Not Met   Progress in Treatment: Attending groups: Yes. Participating in groups: Yes. Taking medication as prescribed: Yes. Toleration medication: Yes. Family/Significant other contact made: No, will contact:  patient refused consent for collateral contact Patient understands diagnosis: Yes. Discussing patient identified problems/goals with staff: Yes. Medical problems stabilized or resolved: Yes. Denies suicidal/homicidal ideation: Yes. Issues/concerns per patient self-inventory: No. Other:   New problem(s) identified: None   New Short Term/Long Term Goal(s): medication stabilization, elimination of SI thoughts, development of comprehensive mental wellness plan.   Patient Goals: "to get started on some medsto help my anxiety"  Discharge Plan or Barriers: Patient plans to discharge home with her husband and children. She plans to follow up with [facility] for medication management and therapy services.   Reason for Continuation of Hospitalization: Anxiety Medication stabilization  Estimated Length of Stay: Friday, 09/02/17  Attendees: Patient: Bernedette Auston 08/28/2017 3:31 PM  Physician: Dr. Neita Garnet, MD 08/28/2017 3:31 PM  Nursing: Rubin Payor 08/28/2017 3:31 PM  RN Care Manager:X 08/28/2017 3:31 PM  Social Worker: Radonna Ricker, [place] 08/28/2017 3:31 PM  Recreational Therapist: Rhunette Croft 08/28/2017 3:31 PM  Other: X 08/28/2017 3:31 PM   Other: X 08/28/2017 3:31 PM  Other:X 08/28/2017 3:31 PM    Scribe for Treatment Team: Marylee Floras, [place] 08/28/2017 3:31 PM

## 2017-08-28 NOTE — Plan of Care (Signed)
  Problem: Activity: Goal: Interest or engagement in activities will improve Outcome: Progressing   Problem: Safety: Goal: Periods of time without injury will increase Outcome: Progressing   DAR NOTE: Patient presents with anxious affect and depressed mood.  Pt has been observed in the milieu interacting with peers. Pt also has been observed trying to be other pt advocating asking for favors on their behalf. Pt stated she gets anxious when other people get  into her and learning how to let go. Reports fair sleep, fair appetite, normal energy and good concentration. Denies pain, auditory and visual hallucinations.  Rates depression at 3, hopelessness at 0, and anxiety at 4.  Maintained on routine safety checks.  Medications given as prescribed.  Support and encouragement offered as needed.  Attended group and participated.  States goal for today is "myself."  Patient observed socializing with peers in the dayroom.  Will continue to monitor.
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
## 2017-08-28 NOTE — [facility] Group Notes (Signed)
Adult Psychoeducational Group Note  Date:  08/28/2017 Time:  12:09 AM  Group Topic/Focus:  Wrap-Up Group:   The focus of this group is to help patients review their daily goal of treatment and discuss progress on daily workbooks.  Participation Level:  Active  Participation Quality:  Appropriate and Attentive  Affect:  Appropriate  Cognitive:  Alert and Appropriate  Insight: Appropriate and Good  Engagement in Group:  Engaged  Modes of Intervention:  Discussion and Education  Additional Comments:  Pt attended and participated in wrap up group this evening. Pt had a great day from when they woke up because they felt calm and less anxious. Pt goal was to start a new med and it has helped them with staying calm and feeling less.   Chrisandra Netters 08/28/2017, 12:09 AM

## 2017-08-29 DIAGNOSIS — F339 Major depressive disorder, recurrent, unspecified: Secondary | ICD-10-CM

## 2017-08-29 DIAGNOSIS — Z630 Problems in relationship with spouse or partner: Secondary | ICD-10-CM

## 2017-08-29 LAB — GLUCOSE, CAPILLARY: GLUCOSE-CAPILLARY: 75 mg/dL (ref 65–99)

## 2017-08-29 NOTE — Progress Notes (Signed)
Pt presents with an animated affect and anxious mood. Pt c/o ongoing anxiety and decreased depression today. Pt denies SI/HI. Pt reports good sleep at bedtime. Pt noted to be fidgety, have rapid pressured speech and intrusive. Pt mood is labile at times and pt have to be redirected by staff for intrusive and inappropriate behaviors on the unit. Pt expressed to writer that she's on her menstrual cycle and that in the past she's had large clots that causes her to pass out. Writer informed MD in progression of pt's concerns.   Medications reviewed with pt.  Verbal support provided. Pt encouraged to attend groups. 15 minute checks performed for safety.   Pt compliant with tx plan. No side effects to meds verbalized by pt.

## 2017-08-29 NOTE — Progress Notes (Signed)
Adult Psychoeducational Group Note  Date:  08/29/2017 Time:  6:40 PM  Group Topic/Focus:  Coping With Mental Health Crisis:   The purpose of this group is to help patients identify strategies for coping with mental health crisis.  Group discusses possible causes of crisis and ways to manage them effectively.  Participation Level:  Active  Participation Quality:  Appropriate  Affect:  Excited  Cognitive:  Appropriate  Insight: Good  Engagement in Group:  Engaged  Modes of Intervention:  Discussion  Additional Comments:  Patient was very active in group  [name], Amy Whitaker 08/29/2017, 6:40 PM

## 2017-08-29 NOTE — Progress Notes (Signed)
[facility] MD Progress Note  08/29/2017 5:08 PM Amy Whitaker  MRN:  [id number] Subjective:  Reports she continues to feel better , states " I feel Effexor has helped already", and describes improving mood and a subjective sense of feeling calmer. States she had good visit from family, and feels she has been able to be more assertive. For example, states that a stressor for her is that her husband communicates " everything that happens at home, any little argument we have " to his mother, which she feels is an intrusion on her privacy. States she was able to tell him that this was something he needed to work on and change.  Denies suicidal ideations. Denies medication side effects.  Objective : I have discussed case with treatment team and have met with patient . 22 year old married female, presented to hospital due to worsening depression, suicidal ideations, anxiety. She had stopped Lexapro trial recently due to feeling excessively sedated on this medication and reports mood had deteriorated further after stopping it . At this time patient reports improving mood, and presents with a reactive affect. Staff has reported patient has presented  labile in affect ,has needed redirection for intrusive behaviors. At this time affect improved ,and does not  present irritable or expansive .  Denies medication side effects. Patient visible on unit, has been going to groups, interactive with peers .       Principal Problem:Depresion, Anxiety   Diagnosis:   Patient Active Problem List   Diagnosis Date Noted  . Bipolar 1 disorder, depressed ([place]) [F31.9] 08/26/2017  . Urticaria [L50.9] 12/01/2015  . Anxiety [F41.9] 12/01/2015   Total Time spent with patient: 20 minutes  Past Medical History:  Past Medical History:  Diagnosis Date  . Anxiety   . Depression   . Urticaria     Past Surgical History:  Procedure Laterality Date  . NO PAST SURGERIES     Family History:  Family History  Problem  Relation Age of Onset  . Asthma Maternal Grandmother   . COPD Maternal Grandmother   . Eczema Maternal Grandmother   . Allergic rhinitis Maternal Grandmother   . Eczema Maternal Grandfather   . Angioedema Neg Hx   . Immunodeficiency Neg Hx   . Urticaria Neg Hx    Social History:  Social History   Substance and Sexual Activity  Alcohol Use Yes     Social History   Substance and Sexual Activity  Drug Use Yes  . Types: Marijuana    Social History   Socioeconomic History  . Marital status: Married    Spouse name: Not on file  . Number of children: Not on file  . Years of education: Not on file  . Highest education level: Not on file  Occupational History  . Not on file  Social Needs  . Financial resource strain: Not on file  . Food insecurity:    Worry: Not on file    Inability: Not on file  . Transportation needs:    Medical: Not on file    Non-medical: Not on file  Tobacco Use  . Smoking status: Former Research scientist (life sciences)  . Smokeless tobacco: Never Used  Substance and Sexual Activity  . Alcohol use: Yes  . Drug use: Yes    Types: Marijuana  . Sexual activity: Yes    Birth control/protection: None  Lifestyle  . Physical activity:    Days per week: Not on file    Minutes per session: Not on file  .  Stress: Not on file  Relationships  . Social connections:    Talks on phone: Not on file    Gets together: Not on file    Attends religious service: Not on file    Active member of club or organization: Not on file    Attends meetings of clubs or organizations: Not on file    Relationship status: Not on file  Other Topics Concern  . Not on file  Social History Narrative  . Not on file   Additional Social History:   Sleep: improving   Appetite:  improving   Current Medications: Current Facility-Administered Medications  Medication Dose Route Frequency Provider Last Rate Last Dose  . acetaminophen (TYLENOL) tablet 650 mg  650 mg Oral Q6H PRN Elmarie Shiley A, NP    650 mg at 08/29/17 0946  . alum & mag hydroxide-simeth (MAALOX/MYLANTA) 200-200-20 MG/5ML suspension 30 mL  30 mL Oral Q4H PRN Elmarie Shiley A, NP      . hydrOXYzine (ATARAX/VISTARIL) tablet 25 mg  25 mg Oral TID PRN Niel Hummer, NP   25 mg at 08/28/17 1037  . magnesium hydroxide (MILK OF MAGNESIA) suspension 30 mL  30 mL Oral Daily PRN Elmarie Shiley A, NP      . traZODone (DESYREL) tablet 50 mg  50 mg Oral QHS PRN Niel Hummer, NP   50 mg at 08/28/17 2259  . venlafaxine XR (EFFEXOR-XR) 24 hr capsule 75 mg  75 mg Oral Q breakfast Cobos, Myer Peer, MD   75 mg at 08/29/17 0938    Lab Results:  Results for orders placed or performed during the hospital encounter of 08/26/17 (from the past 48 hour(s))  TSH     Status: None   Collection Time: 08/28/17  6:30 AM  Result Value Ref Range   TSH 2.132 0.350 - 4.500 uIU/mL    Comment: Performed by a 3rd Generation assay with a functional sensitivity of <=0.01 uIU/mL. Performed at [facility], [place] [address], [place], [place] [address]   Glucose, capillary     Status: None   Collection Time: 08/29/17  4:13 PM  Result Value Ref Range   Glucose-Capillary 75 65 - 99 mg/dL   Comment 1 Notify RN    Comment 2 Document in Chart     Blood Alcohol level:  Lab Results  Component Value Date   ETH <10 08/26/2017   ETH <11 37/16/9678    Metabolic Disorder Labs: No results found for: HGBA1C, MPG No results found for: PROLACTIN No results found for: CHOL, TRIG, HDL, CHOLHDL, VLDL, LDLCALC  Physical Findings: AIMS: Facial and Oral Movements Muscles of Facial Expression: None, normal Lips and Perioral Area: None, normal Jaw: None, normal Tongue: None, normal,Extremity Movements Upper (arms, wrists, hands, fingers): None, normal Lower (legs, knees, ankles, toes): None, normal, Trunk Movements Neck, shoulders, hips: None, normal, Overall Severity Severity of abnormal movements (highest score from questions above): None,  normal Incapacitation due to abnormal movements: None, normal Patient's awareness of abnormal movements (rate only patient's report): No Awareness, Dental Status Current problems with teeth and/or dentures?: No Does patient usually wear dentures?: No  CIWA:    COWS:     Musculoskeletal: Strength & Muscle Tone: within normal limits Gait & Station: normal Patient leans: N/A  Psychiatric Specialty Exam: Physical Exam  ROS denies headache, no chest pain, no shortness of breath, no vomiting   Blood pressure 108/89, pulse 83, temperature 98.1 F (36.7 C), temperature source Oral, resp.  rate 20, height 5' (1.524 m), weight 46.3 kg (102 lb), unknown if currently breastfeeding.Body mass index is 19.92 kg/m.  General Appearance: improved grooming   Eye Contact:  Good  Speech:  Normal Rate- not pressured at this time  Volume:  Normal  Mood:  reports she is feeling better  Affect:  reactive, full in range, not presenting irritable or expansive in affect at this time  Thought Process:  Linear and Descriptions of Associations: Intact  Orientation:  Full (Time, Place, and Person)  Thought Content:  denies hallucinations, no delusions,not internally preoccupied  Suicidal Thoughts:  No denies suicidal or self injurious ideations , denies homicidal or violent ideations  Homicidal Thoughts:  No  Memory:  recent and remote grossly intact   Judgement:  Other:  improving   Insight:  improving   Psychomotor Activity:  Normal  Concentration:  Concentration: Good and Attention Span: Good  Recall:  Good  Fund of Knowledge:  Good  Language:  Good  Akathisia:  Negative  Handed:  Right  AIMS (if indicated):     Assets:  Communication Skills Desire for Improvement Resilience  ADL's:  Intact  Cognition:  WNL  Sleep:  Number of Hours: 5.75   Assessment - patient reports she is feeling better, less depressed, less anxious , and currently denies suicidal ideations. Thus far has tolerated Effexor XR  trial well. As reported by staff  been noted to be hyper verbal and  somewhat intrusive at times, requiring gentle redirections. She acknowledges " I do talk a lot, but that is just my personality, I have always been like that". Currently is not presenting with pressured speech, expansive or irritable affect, racing thoughts or grandiose ideations,  and reports she is sleeping well .   Treatment Plan Summary: Daily contact with patient to assess and evaluate symptoms and progress in treatment, Medication management, Plan inpatient treatment  and medications as below   Treatment Plan reviewed as below today 5/16 Encourage group and milieu participation to work on coping skill and symptom reduction Continue Effexor XR to 75 mgrs QDAY for depression, anxiety Continue Trazodone 50 mgrs QHS for insomnia as needed Continue Vistaril 25 mgr Q 6 hours PRN for anxiety Treatment team working on disposition planning options  Jenne Campus, MD 08/29/2017, 5:08 PM   Patient ID: Garey Ham, female   DOB: 11/23/1995, 22 y.o.   MRN: [id number]

## 2017-08-29 NOTE — [facility] Group Notes (Signed)
[facility] LCSW Group Therapy Note  Date/Time: 08/29/17, 1315  Type of Therapy/Topic:  Group Therapy:  Balance in Life  Participation Level:  active Description of Group:    This group will address the concept of balance and how it feels and looks when one is unbalanced. Patients will be encouraged to process areas in their lives that are out of balance, and identify reasons for remaining unbalanced. Facilitators will guide patients utilizing problem- solving interventions to address and correct the stressor making their life unbalanced. Understanding and applying boundaries will be explored and addressed for obtaining  and maintaining a balanced life. Patients will be encouraged to explore ways to assertively make their unbalanced needs known to significant others in their lives, using other group members and facilitator for support and feedback.  Therapeutic Goals: 1. Patient will identify two or more emotions or situations they have that consume much of in their lives. 2. Patient will identify signs/triggers that life has become out of balance:  3. Patient will identify two ways to set boundaries in order to achieve balance in their lives:  4. Patient will demonstrate ability to communicate their needs through discussion and/or role plays  Summary of Patient Progress:Pt identified family and physical as areas of her life that are out of balance.  Pt was active in the group discussion about ways to move life back towards balance.           Therapeutic Modalities:   Cognitive Behavioral Therapy Solution-Focused Therapy Assertiveness Training  Daleen Squ[place], [place]

## 2017-08-29 NOTE — Progress Notes (Signed)
Patient ID: Amy Whitaker, female   DOB: [birth year], 22 y.o.   MRN: [id number]  Pt currently presents with a masked affect and anxious, impulsive behavior. Speech remains pressured and loud. Pt reports to Clinical research associate that their goal is to "go home tomorrow." Pt denies any concerns, reports she has mended relationships with her husband and mother in law. Reports she will focus on healthy "communication" as a part of her discharge plans. Pt reports good sleep with current medication regimen.   Pt provided with medications per providers orders. Pt's labs and vitals were monitored throughout the night. Pt given a 1:1 about emotional and mental status. Pt supported and encouraged to express concerns and questions. Pt educated on medications and assertiveness techniques.  Pt's safety ensured with 15 minute and environmental checks. Pt currently denies SI/HI and A/V hallucinations. Pt verbally agrees to seek staff if SI/HI or A/VH occurs and to consult with staff before acting on any harmful thoughts. Will continue POC.

## 2017-08-30 DIAGNOSIS — F1390 Sedative, hypnotic, or anxiolytic use, unspecified, uncomplicated: Secondary | ICD-10-CM

## 2017-08-30 DIAGNOSIS — F4310 Post-traumatic stress disorder, unspecified: Secondary | ICD-10-CM

## 2017-08-30 DIAGNOSIS — F515 Nightmare disorder: Secondary | ICD-10-CM

## 2017-08-30 DIAGNOSIS — F331 Major depressive disorder, recurrent, moderate: Secondary | ICD-10-CM

## 2017-08-30 MED ORDER — TRAZODONE HCL 50 MG PO TABS: 50.0000 mg | ORAL_TABLET | Freq: Every evening | ORAL | 0 refills | Status: AC | PRN

## 2017-08-30 MED ORDER — VENLAFAXINE HCL ER 75 MG PO CP24: 75.0000 mg | ORAL_CAPSULE | Freq: Every day | ORAL | 0 refills | Status: AC

## 2017-08-30 MED ORDER — HYDROXYZINE HCL 25 MG PO TABS: 25.0000 mg | ORAL_TABLET | Freq: Three times a day (TID) | ORAL | 0 refills | Status: AC | PRN

## 2017-08-30 NOTE — Progress Notes (Addendum)
D Pt is prepared by this Clinical research associate for [employer] as she verbally contracts for [employer].  She is given all dc instructions and these are reviewed with her by this writer ( SRA, AVS, SSP and transiton record) and she is given cc of paperwork . Pt stated understanding and said "... Yeah I do  ' when asked by this writer if she understands.     A She asks appropriate questions and  States understanding to this nurse's explanation.      R Safety is in place and poc cont.

## 2017-08-30 NOTE — Progress Notes (Signed)
  [facility] Adult Case Management Discharge Plan :  Will you be returning to the same living situation after discharge:  Yes,  patient is returning home with her husband and children At discharge, do you have transportation home?: Yes,  patient reports her mother is picking her up at discharge Do you have the ability to pay for your medications: No.  Release of information consent forms completed and in the chart;  Patient's signature needed at discharge.  Patient to Follow up at: Follow-up Information    [facility], [facility]. Go on 09/02/2017.   Why:  Appointment for medication management and therapy services is Monday, 09/02/17 at 9:00am. Please be sure to bring your Photo ID, SSN, and any discharge paperwork from the hospital.  Contact information: [address] [place] [address] [phone]           Next level of care provider has access to [facility] Link:yes  Safety Planning and Suicide Prevention discussed: Yes,  with the patient  Have you used any form of tobacco in the last 30 days? (Cigarettes, Smokeless Tobacco, Cigars, and/or Pipes): No  Has patient been referred to the Quitline?: N/A patient is not a smoker  Patient has been referred for addiction treatment: N/A  Maeola Sarah, LCSWA 08/30/2017, 10:07 AM

## 2017-08-30 NOTE — Progress Notes (Signed)
Recreation Therapy Notes  Date: 5.17.19 Time: 0930 Location: [address] Dayroom  Group Topic: Stress Management  Goal Area(s) Addresses:  Patient will verbalize importance of using healthy stress management.  Patient will identify positive emotions associated with healthy stress management.   Intervention: Stress Management  Activity :  Progressive Muscle Relaxation.  LRT lead patients through the process of tensing each muscle group then releasing the tension.  Patients were to follow along as LRT read script to guide patients through the process.  Education:  Stress Management, Discharge Planning.   Education Outcome: Acknowledges edcuation/In group clarification offered/Needs additional education  Clinical Observations/Feedback: Pt did not attend group.    Maysie Parkhill, LRT/CTRS         Maccoy Haubner A 08/30/2017 12:15 PM 

## 2017-08-30 NOTE — Progress Notes (Signed)
Pt verbalized readiness for d/c today. Pt denies SI/HI. No concerns verbalized by pt. Pt status discussed in progression meeting this am.  Orders reviewed with pt. Verbal support provided. Pt encouraged to attend groups. 15 minute checks performed for safety.  Pt compliant with tx plan.  

## 2017-08-30 NOTE — [facility] Suicide Risk Assessment (Signed)
[facility] Discharge Suicide Risk Assessment   Principal Problem: depression Discharge Diagnoses:  Patient Active Problem List   Diagnosis Date Noted  . Bipolar 1 disorder, depressed ([facility]) [F31.9] 08/26/2017  . Urticaria [L50.9] 12/01/2015  . Anxiety [F41.9] 12/01/2015    Total Time spent with patient: 30 minutes  Musculoskeletal: Strength & Muscle Tone: within normal limits Gait & Station: normal Patient leans: N/A  Psychiatric Specialty Exam: ROS denies headache, no chest pain, no shortness of breath, no vomiting, no fever, no chills   Blood pressure 113/70, pulse (!) 110, temperature 98.5 F (36.9 C), temperature source Oral, resp. rate 16, height 5' (1.524 m), weight 46.3 kg (102 lb), unknown if currently breastfeeding.Body mass index is 19.92 kg/m.  General Appearance: Well Groomed  Eye Contact::  Good  Speech:  Normal Rate409  Volume:  Normal  Mood:  improved mood, states " this is the best I have felt in a while"  Affect:  Appropriate and more reactive, less anxious   Thought Process:  Linear and Descriptions of Associations: Intact  Orientation:  Full (Time, Place, and Person)  Thought Content:  no hallucinations, no delusions, not internally preoccupied   Suicidal Thoughts:  No denies any suicidal or self injurious ideations, denies any homicidal or violent ideations  Homicidal Thoughts:  No  Memory:  recent and remote grossly intact   Judgement:  Other:  improving   Insight:  improving   Psychomotor Activity:  Normal  Concentration:  Good  Recall:  Good  Fund of Knowledge:Good  Language: Good  Akathisia:  Negative  Handed:  Right  AIMS (if indicated):     Assets:  Communication Skills Desire for Improvement Physical Health Resilience  Sleep:  Number of Hours: 5.75  Cognition: WNL  ADL's:  Intact   Mental Status Per Nursing Assessment::   On Admission:     Demographic Factors:  22 year old married female, has two children, ages 4, 4 months, lives with her  husband and children, stay home mother  Loss Factors: Does not identify any specific stressors, losses , 4 months post partum  Historical Factors: No prior psychiatric admissions, no history of suicide attempts, no history of self cutting, no history of psychosis, history of anxiety, described as worrying excessively   Risk Reduction Factors:   Responsible for children under 56 years of age, Sense of responsibility to family, Living with another person, especially a relative and Positive coping skills or problem solving skills  Continued Clinical Symptoms:  Alert and attentive, well related, pleasant, calm,  mood improved , " back to normal", affect appropriate, reactive, speech normal, no hallucinations, no delusions, not internally preoccupied , no suicidal or self injurious ideations, no homicidal or violent ideations, future oriented .  No medication side effects. We reviewed medication side effect profiles .   Cognitive Features That Contribute To Risk:  No gross cognitive deficits noted upon discharge. Is alert , attentive, and oriented x 3   Suicide Risk:  Mild:  Suicidal ideation of limited frequency, intensity, duration, and specificity.  There are no identifiable plans, no associated intent, mild dysphoria and related symptoms, good self-control (both objective and subjective assessment), few other risk factors, and identifiable protective factors, including available and accessible social support.  Follow-up Information    [facility]. Go on 09/02/2017.   Why:  Appointment for medication management and therapy services is Monday, 09/02/17 at 9:00am. Please be sure to bring your Photo ID, SSN, and any discharge paperwork from the hospital.  Contact information: [address] [place] [address] [phone]           Plan Of Care/Follow-up recommendations:  Activity:  as tolerated Diet:  regular Tests:  NA Other:  see below  Patient expresses readiness for  discharge and is leaving unit in good spirits . Plans to return home . States mother is going to pick her up later today. Plans to follow up as above .  Craige Cotta, MD 08/30/2017, 12:00 PM
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
## 2018-07-09 NOTE — Progress Notes (Deleted)
Psychiatric Initial Adult Assessment   Patient Identification: Amy Whitaker MRN:  [id number] Date of Evaluation:  07/09/2018 Referral Source: *** Chief Complaint:   Visit Diagnosis: No diagnosis found.  History of Present Illness:   Amy Whitaker is a 23 y.o. year old female with a history of depression, bipolar I disorder by history, anxiety, PTSD, who is referred for bipolar disorder.   According to the chart review, the patient was admitted to [facility] in January; diagnosis includes bipolar I disorder with manic episode. However, she is discharged with venlafaxine 150 mg, hydroxyzine, trazodone only.      Associated Signs/Symptoms: Depression Symptoms:  {DEPRESSION SYMPTOMS:20000} (Hypo) Manic Symptoms:  {BHH MANIC SYMPTOMS:22872} Anxiety Symptoms:  {BHH ANXIETY SYMPTOMS:22873} Psychotic Symptoms:  {BHH PSYCHOTIC SYMPTOMS:22874} PTSD Symptoms: {BHH PTSD FOYDXAJO:87867}  Past Psychiatric History:  Outpatient:  Psychiatry admission: [facility] in 08/2017 Previous suicide attempt:  Past trials of medication:  History of violence:   Previous Psychotropic Medications: {YES/NO:21197}  Substance Abuse History in the last 12 months:  {yes no:314532}  Consequences of Substance Abuse: {BHH CONSEQUENCES OF SUBSTANCE ABUSE:22880}  Past Medical History:  Past Medical History:  Diagnosis Date  . Anxiety   . Depression   . Urticaria     Past Surgical History:  Procedure Laterality Date  . NO PAST SURGERIES      Family Psychiatric History: ***  Family History:  Family History  Problem Relation Age of Onset  . Asthma Maternal Grandmother   . COPD Maternal Grandmother   . Eczema Maternal Grandmother   . Allergic rhinitis Maternal Grandmother   . Eczema Maternal Grandfather   . Angioedema Neg Hx   . Immunodeficiency Neg Hx   . Urticaria Neg Hx     Social History:   Social History   Socioeconomic History  . Marital status: Married    Spouse  name: Not on file  . Number of children: Not on file  . Years of education: Not on file  . Highest education level: Not on file  Occupational History  . Not on file  Social Needs  . Financial resource strain: Not on file  . Food insecurity:    Worry: Not on file    Inability: Not on file  . Transportation needs:    Medical: Not on file    Non-medical: Not on file  Tobacco Use  . Smoking status: Former Games developer  . Smokeless tobacco: Never Used  Substance and Sexual Activity  . Alcohol use: Yes  . Drug use: Yes    Types: Marijuana  . Sexual activity: Yes    Birth control/protection: None  Lifestyle  . Physical activity:    Days per week: Not on file    Minutes per session: Not on file  . Stress: Not on file  Relationships  . Social connections:    Talks on phone: Not on file    Gets together: Not on file    Attends religious service: Not on file    Active member of club or organization: Not on file    Attends meetings of clubs or organizations: Not on file    Relationship status: Not on file  Other Topics Concern  . Not on file  Social History Narrative  . Not on file    Additional Social History: ***  Allergies:   Allergies  Allergen Reactions  . Bupropion Hives  . Ibuprofen Hives    Metabolic Disorder Labs: No results found for: HGBA1C, MPG No results found  for: PROLACTIN No results found for: CHOL, TRIG, HDL, CHOLHDL, VLDL, LDLCALC Lab Results  Component Value Date   TSH 2.132 08/28/2017    Therapeutic Level Labs: No results found for: LITHIUM No results found for: CBMZ No results found for: VALPROATE  Current Medications: Current Outpatient Medications  Medication Sig Dispense Refill  . hydrOXYzine (ATARAX/VISTARIL) 25 MG tablet Take 1 tablet (25 mg total) by mouth 3 (three) times daily as needed for anxiety. 30 tablet 0  . traZODone (DESYREL) 50 MG tablet Take 1 tablet (50 mg total) by mouth at bedtime as needed for sleep. 30 tablet 0  .  venlafaxine XR (EFFEXOR-XR) 75 MG 24 hr capsule Take 1 capsule (75 mg total) by mouth daily with breakfast. 30 capsule 0   No current facility-administered medications for this visit.     Musculoskeletal: Strength & Muscle Tone: within normal limits Gait & Station: normal Patient leans: N/A  Psychiatric Specialty Exam: ROS  unknown if currently breastfeeding.There is no height or weight on file to calculate BMI.  General Appearance: {Appearance:22683}  Eye Contact:  {BHH EYE CONTACT:22684}  Speech:  Clear and Coherent  Volume:  Normal  Mood:  {BHH MOOD:22306}  Affect:  {Affect (PAA):22687}  Thought Process:  Coherent  Orientation:  Full (Time, Place, and Person)  Thought Content:  Logical  Suicidal Thoughts:  {ST/HT (PAA):22692}  Homicidal Thoughts:  {ST/HT (PAA):22692}  Memory:  Immediate;   Good  Judgement:  {Judgement (PAA):22694}  Insight:  {Insight (PAA):22695}  Psychomotor Activity:  Normal  Concentration:  Concentration: Good and Attention Span: Good  Recall:  Good  Fund of Knowledge:Good  Language: Good  Akathisia:  No  Handed:  Right  AIMS (if indicated):  not done  Assets:  Communication Skills Desire for Improvement  ADL's:  Intact  Cognition: WNL  Sleep:  {BHH GOOD/FAIR/POOR:22877}   Screenings: AIMS     [facility]  AIMS Total Score  0    AUDIT     [facility]  Alcohol Use Disorder Identification Test Final Score (AUDIT)  2      Assessment and Plan:  Assessment  Plan  The patient demonstrates the following risk factors for suicide: Chronic risk factors for suicide include: {Chronic Risk Factors for RZNBVAP:01410301}. Acute risk factors for suicide include: {Acute Risk Factors for THYHOOI:75797282}. Protective factors for this patient include: {Protective Factors for Suicide SUOR:56153794}.  Considering these factors, the overall suicide risk at this point appears to be {Desc; low/moderate/high:110033}. Patient {ACTION; IS/IS FEX:61470929} appropriate for outpatient follow up.    Neysa Hotter, MD 3/25/20204:10 PM

## 2018-07-15 ENCOUNTER — Ambulatory Visit (HOSPITAL_COMMUNITY): Payer: Self-pay | Admitting: Psychiatry

## 2019-03-13 ENCOUNTER — Emergency Department (HOSPITAL_COMMUNITY)
Admission: EM | Admit: 2019-03-13 | Discharge: 2019-03-13 | Disposition: A | Discharge status: Home / Self Care | Payer: Medicaid Other | Attending: Emergency Medicine | Admitting: Emergency Medicine

## 2019-03-13 ENCOUNTER — Other Ambulatory Visit: Payer: Self-pay

## 2019-03-13 ENCOUNTER — Encounter (HOSPITAL_COMMUNITY): Payer: Self-pay

## 2019-03-13 DIAGNOSIS — U071 COVID-19: Secondary | ICD-10-CM | POA: Insufficient documentation

## 2019-03-13 DIAGNOSIS — F17210 Nicotine dependence, cigarettes, uncomplicated: Secondary | ICD-10-CM | POA: Diagnosis not present

## 2019-03-13 DIAGNOSIS — R0602 Shortness of breath: Secondary | ICD-10-CM | POA: Diagnosis present

## 2019-03-13 DIAGNOSIS — B349 Viral infection, unspecified: Secondary | ICD-10-CM

## 2019-03-13 NOTE — ED Provider Notes (Signed)
[facility] EMERGENCY DEPARTMENT Provider Note   CSN: [id number] Arrival date & time: 03/13/19  1518     History   Chief Complaint Chief Complaint  Patient presents with  . Shortness of Breath    HPI Amy Whitaker is a 23 y.o. female.     HPI   Amy Whitaker is a 23 y.o. female who presents to the Emergency Department complaining of recurrence of nasal congestion and rhinorrhea with sinus pressure.  Symptoms began 3 months ago and spontaneously resolved.  She states that she went to an urgent care for treatment, and was advised to go to [facility] for a Covid test but she was unable to find transportation.  She notes recurrence of her symptoms 2 to 3 weeks ago with an exposure to a Covid positive person 3 days ago and she now has nausea and vomiting with cough and shortness of breath beginning yesterday.  She had one episode of vomiting this morning, but has since tolerated a small amount of liquids without further vomiting.  No diarrhea.  She denies fever or chills, chest pain, dysuria.  She states other household members were also exposed, but are asymptomatic at present.    Past Medical History:  Diagnosis Date  . Anxiety   . Depression   . Urticaria     Patient Active Problem List   Diagnosis Date Noted  . Moderate episode of recurrent major depressive disorder ([place])   . Bipolar 1 disorder, depressed ([place]) 08/26/2017  . Urticaria 12/01/2015  . Anxiety 12/01/2015    Past Surgical History:  Procedure Laterality Date  . NO PAST SURGERIES       OB History    Gravida  2   Para  1   Term  1   Preterm      AB      Living  1     SAB      TAB      Ectopic      Multiple      Live Births               Home Medications    Prior to Admission medications   Medication Sig Start Date End Date Taking? Authorizing Provider  hydrOXYzine (ATARAX/VISTARIL) 25 MG tablet Take 1 tablet (25 mg total) by mouth 3 (three) times daily as needed for  anxiety. 08/30/17   Starkes-Perry, Gayland Curry, FNP  traZODone (DESYREL) 50 MG tablet Take 1 tablet (50 mg total) by mouth at bedtime as needed for sleep. 08/30/17   Suella Broad, FNP  venlafaxine XR (EFFEXOR-XR) 75 MG 24 hr capsule Take 1 capsule (75 mg total) by mouth daily with breakfast. 08/31/17   Suella Broad, FNP    Family History Family History  Problem Relation Age of Onset  . Asthma Maternal Grandmother   . COPD Maternal Grandmother   . Eczema Maternal Grandmother   . Allergic rhinitis Maternal Grandmother   . Eczema Maternal Grandfather   . Angioedema Neg Hx   . Immunodeficiency Neg Hx   . Urticaria Neg Hx     Social History Social History   Tobacco Use  . Smoking status: Current Some Day Smoker  . Smokeless tobacco: Never Used  Substance Use Topics  . Alcohol use: Not Currently  . Drug use: Yes    Types: Marijuana     Allergies   Bupropion and Ibuprofen   Review of Systems Review of Systems  Constitutional: Negative for appetite change, chills  and fever.  HENT: Positive for congestion, rhinorrhea and sinus pressure. Negative for sore throat and trouble swallowing.   Respiratory: Positive for cough and shortness of breath. Negative for chest tightness and wheezing.   Cardiovascular: Negative for chest pain.  Gastrointestinal: Positive for diarrhea, nausea and vomiting. Negative for abdominal distention and abdominal pain.  Genitourinary: Negative for dysuria and flank pain.  Musculoskeletal: Negative for arthralgias, myalgias, neck pain and neck stiffness.  Skin: Negative for rash.  Neurological: Negative for dizziness, weakness and numbness.  Hematological: Negative for adenopathy.     Physical Exam Updated Vital Signs BP 101/75 (BP Location: Right Arm)   Pulse 81   Temp 98.9 F (37.2 C) (Oral)   Resp 18   SpO2 100%   Physical Exam Vitals signs and nursing note reviewed.  Constitutional:      Appearance: Normal appearance. She is  well-developed. She is not ill-appearing or toxic-appearing.  HENT:     Head: Normocephalic.  Eyes:     Pupils: Pupils are equal, round, and reactive to light.  Neck:     Musculoskeletal: Normal range of motion and neck supple.     Thyroid: No thyromegaly.     Meningeal: Kernig's sign absent.  Cardiovascular:     Rate and Rhythm: Normal rate and regular rhythm.     Pulses: Normal pulses.  Pulmonary:     Effort: Pulmonary effort is normal.     Breath sounds: Normal breath sounds. No wheezing.  Abdominal:     Palpations: Abdomen is soft.     Tenderness: There is no abdominal tenderness. There is no guarding or rebound.  Musculoskeletal: Normal range of motion.  Skin:    General: Skin is warm.     Findings: No rash.  Neurological:     Mental Status: She is alert and oriented to person, place, and time.      ED Treatments / Results  Labs (all labs ordered are listed, but only abnormal results are displayed) Labs Reviewed  NOVEL CORONAVIRUS, NAA (HOSP ORDER, SEND-OUT TO REF LAB; TAT 18-24 HRS)    EKG None  Radiology No results found.  Procedures Procedures (including critical care time)  Medications Ordered in ED Medications - No data to display   Initial Impression / Assessment and Plan / ED Course  I have reviewed the triage vital signs and the nursing notes.  Pertinent labs & imaging results that were available during my care of the patient were reviewed by me and considered in my medical decision making (see chart for details).      Patient is well-appearing.  Vital signs reviewed, no fever.  No hypoxia, tachycardia, or tachypnea.  Patient with recent Covid exposure and symptomatic.  She agrees to isolate at home until test results are back.  Covid test is pending.    Final Clinical Impressions(s) / ED Diagnoses   Final diagnoses:  Viral illness    ED Discharge Orders    None       Rosey Bath 03/13/19 1631    Raeford Razor, MD  03/15/19 [phone]

## 2019-03-13 NOTE — Discharge Instructions (Addendum)
Your COVID test is pending.  You will need to isolate yourself at home at least until your test results are back.  You may follow your results on MyChart.   You may take tylenol cold and sinus  to help with symptoms.  Robitussin as directed if needed for cough.

## 2019-03-13 NOTE — ED Triage Notes (Signed)
Pt reports she was exposed to covid on Tuesday. Pt reports nasal congestion, headache , and one episode of vomiting. States feels SOB since yesterday

## 2019-03-14 LAB — NOVEL CORONAVIRUS, NAA (HOSP ORDER, SEND-OUT TO REF LAB; TAT 18-24 HRS): SARS-CoV-2, NAA: DETECTED — AB

## 2019-03-15 ENCOUNTER — Telehealth: Payer: Self-pay | Admitting: Nurse Practitioner

## 2019-03-15 NOTE — Telephone Encounter (Signed)
Attempt to contact patient about Covid symptoms and the use of bamlanivimab, a monoclonal antibody infusion for those with mild to moderate Covid symptoms and at a high risk of hospitalization.  Pt may not qualify, as on chart review no co morbidities noted (such as asthma, diabetes, or obesity).  General HIPAA compliant message left.

## 2022-09-07 ENCOUNTER — Encounter (HOSPITAL_COMMUNITY): Payer: Self-pay | Admitting: Emergency Medicine

## 2022-09-07 ENCOUNTER — Other Ambulatory Visit: Payer: Self-pay

## 2022-09-07 ENCOUNTER — Emergency Department (HOSPITAL_COMMUNITY)
Admission: EM | Admit: 2022-09-07 | Discharge: 2022-09-08 | Disposition: A | Discharge status: Home / Self Care | Payer: Medicaid Other | Attending: Emergency Medicine | Admitting: Emergency Medicine

## 2022-09-07 DIAGNOSIS — M7989 Other specified soft tissue disorders: Secondary | ICD-10-CM | POA: Diagnosis present

## 2022-09-07 DIAGNOSIS — L03032 Cellulitis of left toe: Secondary | ICD-10-CM | POA: Insufficient documentation

## 2022-09-07 DIAGNOSIS — L03039 Cellulitis of unspecified toe: Secondary | ICD-10-CM

## 2022-09-07 NOTE — ED Triage Notes (Signed)
Pt c/o left great toe pain x about a week. She clipped her toenails and then within a few days she noticed increased pain, redness, and swelling. Pt took 1500mg  tylenol at about 5pm this evening.

## 2022-09-08 MED ORDER — DOXYCYCLINE HYCLATE 100 MG PO TABS
100.0000 mg | ORAL_TABLET | Freq: Once | ORAL | Status: AC
  Administered 2022-09-08: 100 mg via ORAL
  Filled 2022-09-08: qty 1

## 2022-09-08 MED ORDER — LIDOCAINE HCL (PF) 1 % IJ SOLN: 5.0000 mL | Freq: Once | INTRAMUSCULAR | Status: AC

## 2022-09-08 MED ORDER — LIDOCAINE HCL (PF) 1 % IJ SOLN
INTRAMUSCULAR | Status: AC
  Administered 2022-09-08: 5 mL
  Filled 2022-09-08: qty 5

## 2022-09-08 MED ORDER — DOXYCYCLINE HYCLATE 100 MG PO CAPS: 100.0000 mg | ORAL_CAPSULE | Freq: Two times a day (BID) | ORAL | 0 refills | Status: AC

## 2022-09-08 MED ORDER — BACITRACIN ZINC 500 UNIT/GM EX OINT
TOPICAL_OINTMENT | Freq: Two times a day (BID) | CUTANEOUS | Status: DC
  Administered 2022-09-08: 1 via TOPICAL
  Filled 2022-09-08: qty 0.9

## 2022-09-08 NOTE — ED Provider Notes (Signed)
[place] EMERGENCY DEPARTMENT AT [facility]  Provider Note  CSN: [id number] Arrival date & time: 09/07/22 2049  History Chief Complaint  Patient presents with   Toe Pain    Amy Whitaker is a 27 y.o. female here for L great toe pain. Reports she began having swelling, pain and purulent drainage from her lateral nail margin a few days after clipping her toenails. Has tried multiple OTC and topical remedies at home without improvement.    Home Medications Prior to Admission medications   Medication Sig Start Date End Date Taking? Authorizing Provider  doxycycline (VIBRAMYCIN) 100 MG capsule Take 1 capsule (100 mg total) by mouth 2 (two) times daily. 09/08/22  Yes Pollyann Savoy, MD  hydrOXYzine (ATARAX/VISTARIL) 25 MG tablet Take 1 tablet (25 mg total) by mouth 3 (three) times daily as needed for anxiety. 08/30/17   Starkes-Perry, Juel Burrow, FNP  traZODone (DESYREL) 50 MG tablet Take 1 tablet (50 mg total) by mouth at bedtime as needed for sleep. 08/30/17   Maryagnes Amos, FNP  venlafaxine XR (EFFEXOR-XR) 75 MG 24 hr capsule Take 1 capsule (75 mg total) by mouth daily with breakfast. 08/31/17   Maryagnes Amos, FNP     Allergies    Bupropion and Ibuprofen   Review of Systems   Review of Systems Please see HPI for pertinent positives and negatives  Physical Exam BP 103/65   Pulse 96   Temp 98.3 F (36.8 C) (Oral)   Resp 18   Ht 5' (1.524 m)   Wt 40.8 kg   LMP 08/24/2022 (Approximate)   SpO2 93%   Breastfeeding No   BMI 17.58 kg/m   Physical Exam Vitals and nursing note reviewed.  HENT:     Head: Normocephalic.     Nose: Nose normal.  Eyes:     Extraocular Movements: Extraocular movements intact.  Pulmonary:     Effort: Pulmonary effort is normal.  Musculoskeletal:        General: Normal range of motion.     Cervical back: Neck supple.     Comments: Paronychia of L lateral great toe, tender to palpation  Skin:    Findings: No rash  (on exposed skin).  Neurological:     Mental Status: She is alert and oriented to person, place, and time.  Psychiatric:        Mood and Affect: Mood normal.     ED Results / Procedures / Treatments   EKG None  Procedures .Nail Removal  Date/Time: 09/08/2022 1:03 AM  Performed by: Pollyann Savoy, MD Authorized by: Pollyann Savoy, MD   Consent:    Consent obtained:  Verbal   Consent given by:  Patient Location:    Foot:  L big toe Pre-procedure details:    Skin preparation:  Povidone-iodine Anesthesia:    Anesthesia method:  Nerve block   Block anesthetic:  Lidocaine 1% w/o epi   Block outcome:  Anesthesia achieved Nail Removal:    Nail removed:  Partial   Nail side:  Lateral Ingrown nail:    Wedge excision of skin: no   Post-procedure details:    Dressing:  Antibiotic ointment   Procedure completion:  Tolerated well, no immediate complications   Medications Ordered in the ED Medications  bacitracin ointment (has no administration in time range)  doxycycline (VIBRA-TABS) tablet 100 mg (has no administration in time range)  lidocaine (PF) (XYLOCAINE) 1 % injection 5 mL (5 mLs Infiltration Given 09/08/22 0033)  Initial Impression and Plan  Patient here with paronychia, toe was anesthetized and nail margin debrided as above. Wound care instruction and Abx given.   ED Course       MDM Rules/Calculators/A&P Medical Decision Making Problems Addressed: Paronychia of great toe: acute illness or injury  Risk Prescription drug management.     Final Clinical Impression(s) / ED Diagnoses Final diagnoses:  Paronychia of great toe    Rx / DC Orders ED Discharge Orders          Ordered    doxycycline (VIBRAMYCIN) 100 MG capsule  2 times daily        09/08/22 0106             Pollyann Savoy, MD 09/08/22 [phone]

## 2022-12-14 ENCOUNTER — Emergency Department (HOSPITAL_COMMUNITY)
Admission: EM | Admit: 2022-12-14 | Discharge: 2022-12-14 | Discharge status: Against Medical Advice | Payer: Medicaid Other | Source: Home / Self Care
